# Patient Record
Sex: Female | Born: 1955
Health system: Southern US, Community
[De-identification: ages and names within clinical notes are randomized; demographics above are authoritative.]

## PROBLEM LIST (undated history)

## (undated) ENCOUNTER — Emergency Department (HOSPITAL_COMMUNITY): Admission: EM | Payer: Self-pay | Source: Home / Self Care

## (undated) DIAGNOSIS — F419 Anxiety disorder, unspecified: Secondary | ICD-10-CM

## (undated) DIAGNOSIS — Z8701 Personal history of pneumonia (recurrent): Secondary | ICD-10-CM

## (undated) HISTORY — PX: BLADDER SUSPENSION: SHX72

## (undated) HISTORY — PX: ENDOMETRIAL ABLATION: SHX621

## (undated) HISTORY — DX: Personal history of pneumonia (recurrent): Z87.01

## (undated) HISTORY — DX: Anxiety disorder, unspecified: F41.9

---

## 2005-01-17 ENCOUNTER — Ambulatory Visit: Payer: Self-pay | Admitting: Family Medicine

## 2013-03-25 ENCOUNTER — Ambulatory Visit: Payer: Self-pay | Admitting: Family Medicine

## 2015-09-28 ENCOUNTER — Ambulatory Visit (INDEPENDENT_AMBULATORY_CARE_PROVIDER_SITE_OTHER): Payer: BLUE CROSS/BLUE SHIELD | Admitting: *Deleted

## 2015-09-28 DIAGNOSIS — Z23 Encounter for immunization: Secondary | ICD-10-CM | POA: Diagnosis not present

## 2016-07-26 ENCOUNTER — Encounter (HOSPITAL_COMMUNITY): Payer: Self-pay

## 2016-07-26 ENCOUNTER — Emergency Department (HOSPITAL_COMMUNITY): Payer: BLUE CROSS/BLUE SHIELD

## 2016-07-26 ENCOUNTER — Emergency Department (HOSPITAL_COMMUNITY)
Admission: EM | Admit: 2016-07-26 | Discharge: 2016-07-26 | Disposition: A | Discharge status: Home / Self Care | Payer: BLUE CROSS/BLUE SHIELD | Attending: Emergency Medicine | Admitting: Emergency Medicine

## 2016-07-26 DIAGNOSIS — G43909 Migraine, unspecified, not intractable, without status migrainosus: Secondary | ICD-10-CM | POA: Diagnosis not present

## 2016-07-26 DIAGNOSIS — R51 Headache: Secondary | ICD-10-CM

## 2016-07-26 DIAGNOSIS — G43009 Migraine without aura, not intractable, without status migrainosus: Secondary | ICD-10-CM

## 2016-07-26 DIAGNOSIS — R519 Headache, unspecified: Secondary | ICD-10-CM

## 2016-07-26 LAB — CBC WITH DIFFERENTIAL/PLATELET
BASOS PCT: 0 %
Basophils Absolute: 0 10*3/uL (ref 0.0–0.1)
Eosinophils Absolute: 0.2 10*3/uL (ref 0.0–0.7)
Eosinophils Relative: 2 %
HEMATOCRIT: 40.9 % (ref 36.0–46.0)
HEMOGLOBIN: 13.9 g/dL (ref 12.0–15.0)
LYMPHS ABS: 4.4 10*3/uL — AB (ref 0.7–4.0)
Lymphocytes Relative: 47 %
MCH: 30.5 pg (ref 26.0–34.0)
MCHC: 34 g/dL (ref 30.0–36.0)
MCV: 89.7 fL (ref 78.0–100.0)
MONO ABS: 0.9 10*3/uL (ref 0.1–1.0)
MONOS PCT: 10 %
NEUTROS ABS: 3.8 10*3/uL (ref 1.7–7.7)
NEUTROS PCT: 41 %
Platelets: 290 10*3/uL (ref 150–400)
RBC: 4.56 MIL/uL (ref 3.87–5.11)
RDW: 13.7 % (ref 11.5–15.5)
WBC: 9.3 10*3/uL (ref 4.0–10.5)

## 2016-07-26 LAB — COMPREHENSIVE METABOLIC PANEL
ALBUMIN: 3.7 g/dL (ref 3.5–5.0)
ALK PHOS: 85 U/L (ref 38–126)
ALT: 21 U/L (ref 14–54)
ANION GAP: 7 (ref 5–15)
AST: 19 U/L (ref 15–41)
BILIRUBIN TOTAL: 0.6 mg/dL (ref 0.3–1.2)
BUN: 11 mg/dL (ref 6–20)
CALCIUM: 8.2 mg/dL — AB (ref 8.9–10.3)
CO2: 23 mmol/L (ref 22–32)
Chloride: 105 mmol/L (ref 101–111)
Creatinine, Ser: 0.63 mg/dL (ref 0.44–1.00)
GLUCOSE: 93 mg/dL (ref 65–99)
POTASSIUM: 3.6 mmol/L (ref 3.5–5.1)
Sodium: 135 mmol/L (ref 135–145)
TOTAL PROTEIN: 6.6 g/dL (ref 6.5–8.1)

## 2016-07-26 NOTE — ED Triage Notes (Signed)
Complain of headache and knots in her scalp. States she is being treated with an antibiotic

## 2016-07-26 NOTE — ED Notes (Signed)
Pt being transported to MRI.

## 2016-07-26 NOTE — Discharge Instructions (Signed)
Follow-up with Dr. Doonquah in 1-2 weeks °

## 2016-07-26 NOTE — ED Provider Notes (Signed)
[facility] DEPT Provider Note   CSN: [id number] Arrival date & time: 07/26/16  1126  By signing my name below, I, Freida Busmaniana Omoyeni, attest that this documentation has been prepared under the direction and in the presence of Bethann BerkshireJoseph Vivi Piccirilli, MD . Electronically Signed: Freida Busmaniana Omoyeni, Scribe. 07/26/2016. 1:25 PM.   History   Chief Complaint Chief Complaint  Patient presents with  . Migraine     Patient states since July 13 she's had 5 episodes where she has pressure on the back of her head starts having some blurred vision then she can't see anything at all for about 2-3 minutes. Then for about 15 minutes the vision slowly comes back. She has a history of striking her head back in March when she fell with loss of consciousness   The history is provided by the patient. No language interpreter was used.  Migraine  This is a recurrent problem. The current episode started more than 1 week ago. The problem occurs rarely. The problem has been resolved. Associated symptoms include headaches. Pertinent negatives include no chest pain and no abdominal pain. Nothing aggravates the symptoms. Nothing relieves the symptoms. She has tried nothing for the symptoms. The treatment provided no relief.     HPI Comments:  Anne Martinez is a 60 y.o. female who presents to the Emergency Department complaining of intermittent HA x ~3-4 weeks. Pt describes a pressure sensation to the top of her head. She notes she has been getting boils on her scalp; she was evaluated by her PCP and placed on Keflex which she has completed. She reports associated visual disturbance; states she saw "8 of everything" and then had an episode of vision loss. She has had 5 episodes since 8/13 each lasting a few minutes. She notes gradual onset of vision loss and gradual resolution of these episodes.  No alleviating factors noted.    History reviewed. No pertinent past medical history.  There are no active problems to display for this  patient.   History reviewed. No pertinent surgical history.  OB History    No data available       Home Medications    Prior to Admission medications   Not on File    Family History No family history on file.  Social History Social History  Substance Use Topics  . Smoking status: Never Smoker  . Smokeless tobacco: Never Used  . Alcohol use No     Allergies   Review of patient's allergies indicates not on file.   Review of Systems Review of Systems  Constitutional: Negative for appetite change and fatigue.  HENT: Negative for congestion, ear discharge and sinus pressure.   Eyes: Positive for visual disturbance. Negative for discharge.  Respiratory: Negative for cough.   Cardiovascular: Negative for chest pain.  Gastrointestinal: Negative for abdominal pain and diarrhea.  Genitourinary: Negative for frequency and hematuria.  Musculoskeletal: Negative for back pain.  Skin: Positive for rash ("boil").  Neurological: Positive for headaches. Negative for seizures and syncope.  Psychiatric/Behavioral: Negative for hallucinations.    Physical Exam Updated Vital Signs BP 122/84   Pulse 72   Temp 97.8 F (36.6 C)   Resp 16   Ht 5' (1.524 m)   Wt 144 lb (65.3 kg)   SpO2 100%   BMI 28.12 kg/m   Physical Exam  Constitutional: She is oriented to person, place, and time. She appears well-developed.  HENT:  Head: Normocephalic.  Eyes: Conjunctivae and EOM are normal. No scleral icterus.  Neck:  Neck supple. No thyromegaly present.  Cardiovascular: Normal rate and regular rhythm.  Exam reveals no gallop and no friction rub.   No murmur heard. Pulmonary/Chest: No stridor. She has no wheezes. She has no rales. She exhibits no tenderness.  Abdominal: She exhibits no distension. There is no tenderness. There is no rebound.  Musculoskeletal: Normal range of motion. She exhibits no edema.  Lymphadenopathy:    She has no cervical adenopathy.  Neurological: She is  oriented to person, place, and time. She exhibits normal muscle tone. Coordination normal.  Skin: No rash noted. No erythema.  Small boil to top of head ~ 1 cm   Psychiatric: She has a normal mood and affect. Her behavior is normal.  Nursing note and vitals reviewed.    ED Treatments / Results  DIAGNOSTIC STUDIES:  Oxygen Saturation is 100% on RA, normal by my interpretation.    COORDINATION OF CARE:  1:24 PM Discussed treatment plan with pt at bedside and pt agreed to plan.  Labs (all labs ordered are listed, but only abnormal results are displayed) Labs Reviewed - No data to display  EKG  EKG Interpretation None       Radiology No results found.  Procedures Procedures (including critical care time)  Medications Ordered in ED Medications - No data to display   Initial Impression / Assessment and Plan / ED Course  I have reviewed the triage vital signs and the nursing notes.  Pertinent labs & imaging results that were available during my care of the patient were reviewed by me and considered in my medical decision making (see chart for details).  Clinical Course    MRI of the brain was negative. Labs unremarkable. I spoke with the neurologist Dr.Doonquah and he feels the symptoms are migraine related. He is going to follow-up with the patient in about 2 weeks. We are going to start her on a baby aspirin a day  Final Clinical Impressions(s) / ED Diagnoses   Final diagnoses:  None    New Prescriptions New Prescriptions   No medications on file    The chart was scribed for me under my direct supervision.  I personally performed the history, physical, and medical decision making and all procedures in the evaluation of this patient.Bethann Berkshire.     Byrdie Miyazaki, MD 07/26/16 [phone]

## 2016-09-19 ENCOUNTER — Ambulatory Visit (INDEPENDENT_AMBULATORY_CARE_PROVIDER_SITE_OTHER): Payer: BLUE CROSS/BLUE SHIELD | Admitting: *Deleted

## 2016-09-19 DIAGNOSIS — Z23 Encounter for immunization: Secondary | ICD-10-CM | POA: Diagnosis not present

## 2017-10-15 ENCOUNTER — Ambulatory Visit (INDEPENDENT_AMBULATORY_CARE_PROVIDER_SITE_OTHER): Payer: BLUE CROSS/BLUE SHIELD | Admitting: *Deleted

## 2017-10-15 DIAGNOSIS — Z23 Encounter for immunization: Secondary | ICD-10-CM

## 2019-12-15 DIAGNOSIS — Z03818 Encounter for observation for suspected exposure to other biological agents ruled out: Secondary | ICD-10-CM | POA: Diagnosis not present

## 2019-12-15 DIAGNOSIS — R05 Cough: Secondary | ICD-10-CM | POA: Diagnosis not present

## 2019-12-15 DIAGNOSIS — Z6826 Body mass index (BMI) 26.0-26.9, adult: Secondary | ICD-10-CM | POA: Diagnosis not present

## 2019-12-29 DIAGNOSIS — Z20828 Contact with and (suspected) exposure to other viral communicable diseases: Secondary | ICD-10-CM | POA: Diagnosis not present

## 2020-02-16 DIAGNOSIS — R0602 Shortness of breath: Secondary | ICD-10-CM | POA: Diagnosis not present

## 2020-02-16 DIAGNOSIS — R079 Chest pain, unspecified: Secondary | ICD-10-CM | POA: Diagnosis not present

## 2020-02-16 DIAGNOSIS — R002 Palpitations: Secondary | ICD-10-CM | POA: Diagnosis not present

## 2020-02-16 DIAGNOSIS — R350 Frequency of micturition: Secondary | ICD-10-CM | POA: Diagnosis not present

## 2020-02-16 DIAGNOSIS — R69 Illness, unspecified: Secondary | ICD-10-CM | POA: Diagnosis not present

## 2020-02-16 DIAGNOSIS — Z6826 Body mass index (BMI) 26.0-26.9, adult: Secondary | ICD-10-CM | POA: Diagnosis not present

## 2020-02-24 DIAGNOSIS — E7800 Pure hypercholesterolemia, unspecified: Secondary | ICD-10-CM | POA: Diagnosis not present

## 2020-02-24 DIAGNOSIS — Z23 Encounter for immunization: Secondary | ICD-10-CM | POA: Diagnosis not present

## 2020-02-24 DIAGNOSIS — Z0000 Encounter for general adult medical examination without abnormal findings: Secondary | ICD-10-CM | POA: Diagnosis not present

## 2020-02-24 DIAGNOSIS — R05 Cough: Secondary | ICD-10-CM | POA: Diagnosis not present

## 2020-02-24 DIAGNOSIS — R911 Solitary pulmonary nodule: Secondary | ICD-10-CM | POA: Diagnosis not present

## 2020-02-24 DIAGNOSIS — Z6826 Body mass index (BMI) 26.0-26.9, adult: Secondary | ICD-10-CM | POA: Diagnosis not present

## 2020-02-27 DIAGNOSIS — R911 Solitary pulmonary nodule: Secondary | ICD-10-CM | POA: Diagnosis not present

## 2020-02-27 DIAGNOSIS — R918 Other nonspecific abnormal finding of lung field: Secondary | ICD-10-CM | POA: Diagnosis not present

## 2020-02-27 DIAGNOSIS — I700 Atherosclerosis of aorta: Secondary | ICD-10-CM | POA: Diagnosis not present

## 2020-02-27 DIAGNOSIS — R0602 Shortness of breath: Secondary | ICD-10-CM | POA: Diagnosis not present

## 2020-03-05 ENCOUNTER — Encounter: Payer: Self-pay | Admitting: *Deleted

## 2020-03-08 ENCOUNTER — Encounter: Payer: Self-pay | Admitting: *Deleted

## 2020-03-08 ENCOUNTER — Other Ambulatory Visit: Payer: Self-pay

## 2020-03-08 ENCOUNTER — Encounter: Payer: Self-pay | Admitting: Cardiology

## 2020-03-08 ENCOUNTER — Telehealth: Payer: Self-pay | Admitting: Cardiology

## 2020-03-08 ENCOUNTER — Ambulatory Visit (INDEPENDENT_AMBULATORY_CARE_PROVIDER_SITE_OTHER): Payer: Medicare HMO | Admitting: Cardiology

## 2020-03-08 VITALS — BP 112/72 | HR 67 | Ht 60.0 in | Wt 136.0 lb

## 2020-03-08 DIAGNOSIS — R0789 Other chest pain: Secondary | ICD-10-CM | POA: Diagnosis not present

## 2020-03-08 DIAGNOSIS — R0602 Shortness of breath: Secondary | ICD-10-CM | POA: Diagnosis not present

## 2020-03-08 DIAGNOSIS — R300 Dysuria: Secondary | ICD-10-CM | POA: Diagnosis not present

## 2020-03-08 DIAGNOSIS — I700 Atherosclerosis of aorta: Secondary | ICD-10-CM | POA: Diagnosis not present

## 2020-03-08 DIAGNOSIS — N390 Urinary tract infection, site not specified: Secondary | ICD-10-CM | POA: Diagnosis not present

## 2020-03-08 DIAGNOSIS — R002 Palpitations: Secondary | ICD-10-CM

## 2020-03-08 NOTE — Patient Instructions (Signed)
Your physician recommends that you schedule a follow-up appointment PENDING WITH DR MCDOWELL   Your physician recommends that you continue on your current medications as directed. Please refer to the Current Medication list given to you today.  Your physician has requested that you have an echocardiogram. Echocardiography is a painless test that uses sound waves to create images of your heart. It provides your doctor with information about the size and shape of your heart and how well your heart's chambers and valves are working. This procedure takes approximately one hour. There are no restrictions for this procedure.  Thank you for choosing [facility]!!

## 2020-03-08 NOTE — Progress Notes (Signed)
Cardiology Office Note  Date: 03/08/2020   ID: Anne Martinez, DOB 10-31-1956, MRN [id number]  PCP:  Lawerance Sabal, PA  Cardiologist:  Nona Dell, MD Electrophysiologist:  None   Chief Complaint  Patient presents with  . Shortness of Breath    History of Present Illness: Anne Martinez is a 64 y.o. adult referred for cardiology consultation by Ms. Bufford Buttner PA-C with Dr. Reuel Boom for evaluation of shortness of breath and chest discomfort.  I reviewed the available records which are incomplete.  She presents today stating that she has been experiencing a daily morning cough productive of phlegm since last year.  She does not smoke cigarettes, but smokes marijuana daily, states that this makes her cough as well.  She did have COVID-19 back in January, this made her shortness of breath and cough worse.  She feels a "catch" in her left lower costal area, sometimes worse when coughing or taking a deep breath, no definite angina by description.  She does not report any clear sense of palpitations.  She had a chest x-ray done through [facility] as reviewed below with question of a 7 mm left upper lobe nodule.  Reportedly, she had a chest CT done at [facility] last week that did not show this nodule, however she had evidence of "scarring" based on patient description.  I do not have the study for review today.  She also had recent lab work which we are requesting.  She ambulated in the office on pulse oximeter with no results below 97%.  She has tried an MDI per PCP recently without improvement in shortness of breath or coughing.  She states that she had a prior history of drug abuse, was using meth back in the 1990s, also Percocet.  She reports no substance use except marijuana at this point.  She states that she has had many episodes of pneumonia over the years, does not report any clear history of asthma or COPD.  She quit smoking cigarettes back in the 1970s.  She currently works at  [employer], [employer].  Past Medical History:  Diagnosis Date  . Anxiety   . History of pneumonia     Past Surgical History:  Procedure Laterality Date  . BLADDER SUSPENSION    . ENDOMETRIAL ABLATION      Current Outpatient Medications  Medication Sig Dispense Refill  . albuterol (VENTOLIN HFA) 108 (90 Base) MCG/ACT inhaler Inhale into the lungs every 6 (six) hours as needed for wheezing or shortness of breath.     No current facility-administered medications for this visit.   Allergies:  Ceftin [cefuroxime], Penicillins, and Sulfa antibiotics   Social History: The patient  reports that he has quit smoking. His smoking use included cigarettes. He has never used smokeless tobacco. He reports current drug use. Drug: Marijuana. He reports that he does not drink alcohol.   Family History: The patient's family history includes Heart attack in his father; Heart disease in his mother; Hypertension in his mother.   ROS:   Frequent urination and nocturia.  Physical Exam: VS:  BP 112/72   Pulse 67   Ht 5' (1.524 m)   Wt 136 lb (61.7 kg)   SpO2 97%   BMI 26.56 kg/m , BMI Body mass index is 26.56 kg/m.  Wt Readings from Last 3 Encounters:  03/08/20 136 lb (61.7 kg)  07/26/16 144 lb (65.3 kg)    General: Patient appears comfortable at rest. HEENT: Conjunctiva and lids normal, wearing a  mask. Neck: Supple, no elevated JVP or carotid bruits, no thyromegaly. Lungs: No active wheezing, nonlabored breathing at rest. Cardiac: Regular rate and rhythm, no S3, 2/6 systolic murmur, no pericardial rub. Abdomen: Soft, bowel sounds present. Extremities: No pitting edema, distal pulses 2+. Skin: Warm and dry. Musculoskeletal: No kyphosis. Neuropsychiatric: Alert and oriented x3, affect grossly appropriate.  ECG:  An ECG dated 02/16/2020 was personally reviewed today and demonstrated:  Sinus rhythm with PACs.  Recent Labwork:  No interval lab work for review  today.  Other Studies Reviewed Today:  Chest x-ray 02/16/2020 ([facility]): Heart size normal limits, clear lung fields, aortic atherosclerosis, 7 mm nodule within left upper lobe.   Assessment and Plan:  1.  Shortness of breath with daily morning cough, mildly productive.  Patient has a remote history of cigarette use, at this point smokes marijuana daily.  She states that the symptoms have been worse since last year, she also had COVID-19 in January (was not hospitalized).  Requesting recent chest CT from [facility] for review, she may need a formal pulmonary consultation.  She was not found to have oxygen desaturation today in the office with ambulation.  She is already trying to cut back on marijuana use.  She reports an atypical "catch" in her left lower costal area, no definitive angina.  We will obtain an echocardiogram to assess cardiac structure and function.  2.  Incidentally noted aortic atherosclerosis by plain film of the chest.  Will review chest CT results as well.  She was aware of this, I talked with her about making sure lipids are in order, she states that this has been checked by her PCP.  Medication Adjustments/Labs and Tests Ordered: Current medicines are reviewed at length with the patient today.  Concerns regarding medicines are outlined above.   Tests Ordered: Orders Placed This Encounter  Procedures  . ECHOCARDIOGRAM COMPLETE    Medication Changes: No orders of the defined types were placed in this encounter.   Disposition:  Follow up test results and determine disposition.  Signed, Satira Sark, MD, [facility] 03/08/2020 9:33 AM    [facility] [place], [place], [place] [address] Phone: [phone]; Fax: [phone]

## 2020-03-08 NOTE — Telephone Encounter (Signed)
  Precert needed for:   Echo  Location: [facility]    Date: March 30, 2020

## 2020-03-10 ENCOUNTER — Telehealth: Payer: Self-pay | Admitting: *Deleted

## 2020-03-10 DIAGNOSIS — R059 Cough, unspecified: Secondary | ICD-10-CM

## 2020-03-10 DIAGNOSIS — R0602 Shortness of breath: Secondary | ICD-10-CM

## 2020-03-10 DIAGNOSIS — R05 Cough: Secondary | ICD-10-CM

## 2020-03-10 NOTE — Telephone Encounter (Signed)
Patient informed and verbalized understanding of plan. Per patient, she has not been referred to a pulmonologist. Copy sent to PCP

## 2020-03-10 NOTE — Telephone Encounter (Signed)
-----   Message from Jonelle Sidle, MD sent at 03/08/2020  2:40 PM EDT ----- Results reviewed.  Patient was seen as a new consult this morning.  Please let her know that I had a chance to review her chest CT report.  Report indicated the presence of multiple small bilateral pulmonary nodules, also interstitial lung disease.  Based on this I do think that a formal pulmonary consultation would be in order in light of her cough and shortness of breath.  If she does not already have this planned per PCP, please refer her.

## 2020-03-30 ENCOUNTER — Other Ambulatory Visit: Payer: Self-pay

## 2020-03-30 ENCOUNTER — Ambulatory Visit (INDEPENDENT_AMBULATORY_CARE_PROVIDER_SITE_OTHER): Payer: Medicare HMO

## 2020-03-30 DIAGNOSIS — R0602 Shortness of breath: Secondary | ICD-10-CM | POA: Diagnosis not present

## 2020-04-01 ENCOUNTER — Telehealth: Payer: Self-pay | Admitting: *Deleted

## 2020-04-01 NOTE — Telephone Encounter (Signed)
Patient informed. Copy sent to PCP °

## 2020-04-01 NOTE — Telephone Encounter (Signed)
-----   Message from Jonelle Sidle, MD sent at 03/30/2020 12:04 PM EDT ----- Results reviewed.  LVEF is normal at 60 to 65% with mild diastolic dysfunction.  RV contraction normal, no major valvular abnormalities beyond moderate tricuspid regurgitation.  No further cardiac testing is planned.  As already mentioned after review of outside chest CT, formal pulmonary consultation is recommended.

## 2020-04-13 DIAGNOSIS — S61431A Puncture wound without foreign body of right hand, initial encounter: Secondary | ICD-10-CM | POA: Diagnosis not present

## 2020-04-13 DIAGNOSIS — Z6827 Body mass index (BMI) 27.0-27.9, adult: Secondary | ICD-10-CM | POA: Diagnosis not present

## 2020-04-13 DIAGNOSIS — M79641 Pain in right hand: Secondary | ICD-10-CM | POA: Diagnosis not present

## 2020-05-06 DIAGNOSIS — S61431A Puncture wound without foreign body of right hand, initial encounter: Secondary | ICD-10-CM | POA: Diagnosis not present

## 2020-05-06 DIAGNOSIS — R69 Illness, unspecified: Secondary | ICD-10-CM | POA: Diagnosis not present

## 2020-05-06 DIAGNOSIS — M79641 Pain in right hand: Secondary | ICD-10-CM | POA: Diagnosis not present

## 2020-05-06 DIAGNOSIS — Z6826 Body mass index (BMI) 26.0-26.9, adult: Secondary | ICD-10-CM | POA: Diagnosis not present

## 2020-05-07 ENCOUNTER — Other Ambulatory Visit: Payer: Self-pay

## 2020-05-07 ENCOUNTER — Encounter: Payer: Self-pay | Admitting: Internal Medicine

## 2020-05-07 ENCOUNTER — Ambulatory Visit: Payer: Medicare HMO | Admitting: Internal Medicine

## 2020-05-07 DIAGNOSIS — J449 Chronic obstructive pulmonary disease, unspecified: Secondary | ICD-10-CM | POA: Diagnosis not present

## 2020-05-07 DIAGNOSIS — R918 Other nonspecific abnormal finding of lung field: Secondary | ICD-10-CM | POA: Insufficient documentation

## 2020-05-07 NOTE — Patient Instructions (Signed)
For cough >  mucinex or mucinex dm up to 1200 mg every 12 hours as needed  Symbicort 80 up to 2 puff every 12 hours x one week then try off and restart if needed  Most important step:  Breath clean air   We will call you in March 2022 to set up a follow up ct of your chest (high resolution)

## 2020-05-07 NOTE — Progress Notes (Signed)
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 then p trip to [place] at [place] back one week and had expose to niece both sick Jan 6th and pos test for covid on the 9th with worse symptoms "felt like drowning" with severe cough but never desats gradually improved p 3 weeks and returned to work but noted and gradually improved to baseline but concerned with am mucus productive black spots just in ams so referred to pulmonary clinic in [place]  05/07/2020 by Dr   Domenic Polite with w/u with echo c/w Mild diastolic dysfunction and TR      History of Present Illness  05/07/2020  Pulmonary/ 1st office eval/Anne Martinez  Chief Complaint  Patient presents with  . Pulmonary Consult    Referred by Dr Domenic Polite. Pt c/o SOB x 5 months- gets winded walking up steps.  She states when she gets up in the am she coughs up mucus with black spots in it. She was dx with Covid 19 12/08/19.    Dyspnea:  Not limited by breathing from desired activities  But relattively inactive Cough: mostly in am's is when brings up black specks/ worse with  perfume exp   Sleep: none  SABA use: none    No obvious day to day or daytime variability or assoc excess/ purulent sputum or mucus plugs or hemoptysis or cp or chest tightness, subjective wheeze or overt sinus or hb symptoms.   Sleeping  without nocturnal   xacerbation  of respiratory  c/o's or need for noct saba. Also denies any obvious fluctuation of symptoms with weather or environmental changes or other aggravating or alleviating factors except as outlined above   No unusual exposure hx or h/o childhood pna/ asthma or knowledge of premature birth.  Current  Allergies, Complete Past Medical History, Past Surgical History, Family History, and Social History were reviewed in [facility] record.  ROS  The following are not active complaints unless bolded Hoarseness, sore throat, dysphagia, dental problems, itching, sneezing,  nasal congestion or discharge of excess mucus or purulent secretions, ear ache,   fever, chills, sweats, unintended wt loss or wt gain, classically pleuritic or exertional cp,  orthopnea pnd or arm/hand swelling  or leg swelling, presyncope, palpitations, abdominal pain, anorexia, nausea, vomiting, diarrhea  or change in bowel habits or change in bladder habits, change in stools or change in urine, dysuria, hematuria,  rash, arthralgias, visual complaints, headache, numbness, weakness or ataxia or problems with walking or coordination,  change in mood or  memory.          Past Medical History:  Diagnosis Date  . Anxiety   . History of pneumonia     Outpatient Medications Prior to Visit  Medication Sig Dispense Refill  . SYMBICORT 80-4.5 MCG/ACT inhaler Inhale 2 puffs into the lungs daily.    Marland Kitchen albuterol (VENTOLIN HFA) 108 (90 Base) MCG/ACT inhaler Inhale into the lungs every 6 (six) hours as needed for wheezing or shortness of breath.     No facility-administered medications prior to visit.     Objective:     BP 140/80 (BP Location:  Left Arm, Cuff Size: Normal)   Pulse 90   Temp (!) 97 F (36.1 C) (Temporal)   Ht 5' (1.524 m)   Wt 135 lb (61.2 kg)   SpO2 98% Comment: on RA  BMI 26.37 kg/m   SpO2: 98 %(on RA)    HEENT : pt wearing mask not removed for exam due to covid -19 concerns.    NECK :  without JVD/Nodes/TM/ nl carotid upstrokes bilaterally   LUNGS: no acc muscle use,  Nl contour chest which is clear to A and P bilaterally without cough on insp or exp maneuvers   CV:  RRR  no s3 or murmur or increase in P2, and no edema   ABD:  soft and nontender with nl inspiratory excursion  in the supine position. No bruits or organomegaly appreciated, bowel sounds nl  MS:  Nl gait/ ext warm without deformities, calf tenderness, cyanosis or clubbing No obvious joint restrictions   SKIN: warm and dry without lesions    NEURO:  alert, approp, nl sensorium with  no motor or cerebellar deficits apparent.         Assessment   Multiple pulmonary nodules determined by computed tomography of lung See CT chest 02/27/20  Lungs/Pleura: There are multiple bilateral small pulmonary nodules. The largest nodule is in the left upper lobe measuring 5.5 mm correlating with the density seen on chest x-ray. There are 2 small nodules along the right minor fissure likely representing intrapulmonary nodes. There are multiple other small bilateral pulmonary nodules including a 4 mm nodule at the left lung base posteriorly on image 119 and 4 mm nodule in the posterior aspect of the right upper lobe on image 40 both on series 4. There is chronic interstitial disease in the periphery of both lungs primarily in the lower lobes. rec : re check HRCT  02/21/21 placed in reminder file   CT results reviewed with pt >>> Too small for PET or bx, not suspicious enough for excisional bx > really only option for now is follow the Fleischner society guidelines as rec by radiology = 1 year f/u for both the MPNs and the ? ILD > HRCT as above should be adequate to follow both   Discussed in detail all the  indications, usual  risks and alternatives  relative to the benefits with patient who agrees to proceed with w/u as outlined.     In meantime asked the pt to commit to breathing clean air.                   COPD GOLD 0 clinically with CB/ still smoking  MJ smoker only  - demonstrated use of MDI 05/07/2020   Most likely has CB on basis of inhaled combustion products of MJ and only very mild AB   Based on two studies from NEJM  378; 20 p 1865 (2018) and 380 : p2020-30 (2019) in pts with mild asthma it  is reasonable to use low dose symbicort eg 80 2bid "prn" flare in this setting but I emphasized this was only shown with symbicort and takes advantage of the rapid onset of action but is not the same as "rescue therapy" but can be stopped once the acute symptoms have resolved and the need for rescue has been minimized (< 2 x weekly)    - The proper method of use, as well as anticipated side effects, of a metered-dose inhaler were discussed and demonstrated to the patient.  Each maintenance medication was reviewed in detail including emphasizing most importantly the difference between maintenance and prns and under what circumstances the prns are to be triggered using an action plan format where appropriate.  Total time for H and P, chart review, counseling, teaching device and generating customized AVS unique to this office visit / charting = 60 min            Sandrea Hughs, MD 05/07/2020

## 2020-05-07 NOTE — Assessment & Plan Note (Addendum)
See CT chest 02/27/20  Lungs/Pleura: There are multiple bilateral small pulmonary nodules. The largest nodule is in the left upper lobe measuring 5.5 mm correlating with the density seen on chest x-ray. There are 2 small nodules along the right minor fissure likely representing intrapulmonary nodes. There are multiple other small bilateral pulmonary nodules including a 4 mm nodule at the left lung base posteriorly on image 119 and 4 mm nodule in the posterior aspect of the right upper lobe on image 40 both on series 4. There is chronic interstitial disease in the periphery of both lungs primarily in the lower lobes. rec : re check HRCT  02/21/21 placed in reminder file   CT results reviewed with pt >>> Too small for PET or bx, not suspicious enough for excisional bx > really only option for now is follow the Fleischner society guidelines as rec by radiology = 1 year f/u for both the MPNs and the ? ILD > HRCT as above should be adequate to follow both   Discussed in detail all the  indications, usual  risks and alternatives  relative to the benefits with patient who agrees to proceed with w/u as outlined.     In meantime asked the pt to commit to breathing clean air.

## 2020-05-07 NOTE — Assessment & Plan Note (Signed)
MJ smoker only  - demonstrated use of MDI 05/07/2020   Most likely has CB on basis of inhaled combustion products of MJ and only very mild AB   Based on two studies from NEJM  378; 20 p 1865 (2018) and 380 : p2020-30 (2019) in pts with mild asthma it is reasonable to use low dose symbicort eg 80 2bid "prn" flare in this setting but I emphasized this was only shown with symbicort and takes advantage of the rapid onset of action but is not the same as "rescue therapy" but can be stopped once the acute symptoms have resolved and the need for rescue has been minimized (< 2 x weekly)    - The proper method of use, as well as anticipated side effects, of a metered-dose inhaler were discussed and demonstrated to the patient.          Each maintenance medication was reviewed in detail including emphasizing most importantly the difference between maintenance and prns and under what circumstances the prns are to be triggered using an action plan format where appropriate.  Total time for H and P, chart review, counseling, teaching device and generating customized AVS unique to this office visit / charting = 60 min

## 2020-06-28 DIAGNOSIS — R69 Illness, unspecified: Secondary | ICD-10-CM | POA: Diagnosis not present

## 2020-07-03 DIAGNOSIS — M25572 Pain in left ankle and joints of left foot: Secondary | ICD-10-CM | POA: Diagnosis not present

## 2020-07-03 DIAGNOSIS — S93402A Sprain of unspecified ligament of left ankle, initial encounter: Secondary | ICD-10-CM | POA: Diagnosis not present

## 2020-08-02 DIAGNOSIS — N3946 Mixed incontinence: Secondary | ICD-10-CM | POA: Diagnosis not present

## 2020-08-02 DIAGNOSIS — N8111 Cystocele, midline: Secondary | ICD-10-CM | POA: Diagnosis not present

## 2020-08-02 DIAGNOSIS — R82998 Other abnormal findings in urine: Secondary | ICD-10-CM | POA: Diagnosis not present

## 2020-08-02 DIAGNOSIS — R8290 Unspecified abnormal findings in urine: Secondary | ICD-10-CM | POA: Diagnosis not present

## 2020-08-02 DIAGNOSIS — N958 Other specified menopausal and perimenopausal disorders: Secondary | ICD-10-CM | POA: Diagnosis not present

## 2020-08-02 DIAGNOSIS — N8110 Cystocele, unspecified: Secondary | ICD-10-CM | POA: Diagnosis not present

## 2020-08-02 DIAGNOSIS — N3281 Overactive bladder: Secondary | ICD-10-CM | POA: Diagnosis not present

## 2020-08-05 DIAGNOSIS — N3281 Overactive bladder: Secondary | ICD-10-CM | POA: Diagnosis not present

## 2020-08-05 DIAGNOSIS — N3946 Mixed incontinence: Secondary | ICD-10-CM | POA: Diagnosis not present

## 2020-08-05 DIAGNOSIS — N993 Prolapse of vaginal vault after hysterectomy: Secondary | ICD-10-CM | POA: Diagnosis not present

## 2020-08-05 DIAGNOSIS — N3941 Urge incontinence: Secondary | ICD-10-CM | POA: Diagnosis not present

## 2020-08-05 DIAGNOSIS — Z8701 Personal history of pneumonia (recurrent): Secondary | ICD-10-CM | POA: Diagnosis not present

## 2020-08-05 DIAGNOSIS — N949 Unspecified condition associated with female genital organs and menstrual cycle: Secondary | ICD-10-CM | POA: Diagnosis not present

## 2020-08-05 DIAGNOSIS — N393 Stress incontinence (female) (male): Secondary | ICD-10-CM | POA: Diagnosis not present

## 2020-09-01 DIAGNOSIS — R69 Illness, unspecified: Secondary | ICD-10-CM | POA: Diagnosis not present

## 2020-09-01 DIAGNOSIS — J189 Pneumonia, unspecified organism: Secondary | ICD-10-CM | POA: Diagnosis not present

## 2020-09-07 DIAGNOSIS — Z8701 Personal history of pneumonia (recurrent): Secondary | ICD-10-CM | POA: Diagnosis not present

## 2020-09-15 DIAGNOSIS — Z23 Encounter for immunization: Secondary | ICD-10-CM | POA: Diagnosis not present

## 2020-09-29 DIAGNOSIS — Z23 Encounter for immunization: Secondary | ICD-10-CM | POA: Diagnosis not present

## 2020-10-14 DIAGNOSIS — H6122 Impacted cerumen, left ear: Secondary | ICD-10-CM | POA: Diagnosis not present

## 2020-10-19 DIAGNOSIS — N993 Prolapse of vaginal vault after hysterectomy: Secondary | ICD-10-CM | POA: Diagnosis not present

## 2020-10-19 DIAGNOSIS — N393 Stress incontinence (female) (male): Secondary | ICD-10-CM | POA: Diagnosis not present

## 2020-10-19 DIAGNOSIS — N3946 Mixed incontinence: Secondary | ICD-10-CM | POA: Diagnosis not present

## 2020-10-22 DIAGNOSIS — N393 Stress incontinence (female) (male): Secondary | ICD-10-CM | POA: Diagnosis not present

## 2020-10-22 DIAGNOSIS — N958 Other specified menopausal and perimenopausal disorders: Secondary | ICD-10-CM | POA: Diagnosis not present

## 2020-10-22 DIAGNOSIS — Z780 Asymptomatic menopausal state: Secondary | ICD-10-CM | POA: Diagnosis not present

## 2020-10-22 DIAGNOSIS — N3941 Urge incontinence: Secondary | ICD-10-CM | POA: Diagnosis not present

## 2020-10-22 DIAGNOSIS — N812 Incomplete uterovaginal prolapse: Secondary | ICD-10-CM | POA: Diagnosis not present

## 2020-10-22 DIAGNOSIS — R69 Illness, unspecified: Secondary | ICD-10-CM | POA: Diagnosis not present

## 2020-10-22 DIAGNOSIS — N3281 Overactive bladder: Secondary | ICD-10-CM | POA: Diagnosis not present

## 2020-10-22 DIAGNOSIS — Z8616 Personal history of COVID-19: Secondary | ICD-10-CM | POA: Diagnosis not present

## 2020-10-22 DIAGNOSIS — N993 Prolapse of vaginal vault after hysterectomy: Secondary | ICD-10-CM | POA: Diagnosis not present

## 2020-10-23 DIAGNOSIS — N993 Prolapse of vaginal vault after hysterectomy: Secondary | ICD-10-CM | POA: Diagnosis not present

## 2020-10-27 DIAGNOSIS — Z8742 Personal history of other diseases of the female genital tract: Secondary | ICD-10-CM | POA: Diagnosis not present

## 2020-11-02 DIAGNOSIS — N393 Stress incontinence (female) (male): Secondary | ICD-10-CM | POA: Diagnosis not present

## 2020-11-02 DIAGNOSIS — Z8616 Personal history of COVID-19: Secondary | ICD-10-CM | POA: Diagnosis not present

## 2020-11-23 DIAGNOSIS — N393 Stress incontinence (female) (male): Secondary | ICD-10-CM | POA: Diagnosis not present

## 2020-11-23 DIAGNOSIS — N993 Prolapse of vaginal vault after hysterectomy: Secondary | ICD-10-CM | POA: Diagnosis not present

## 2020-12-23 DIAGNOSIS — U071 COVID-19: Secondary | ICD-10-CM | POA: Diagnosis not present

## 2020-12-23 DIAGNOSIS — R509 Fever, unspecified: Secondary | ICD-10-CM | POA: Diagnosis not present

## 2020-12-23 DIAGNOSIS — Z20822 Contact with and (suspected) exposure to covid-19: Secondary | ICD-10-CM | POA: Diagnosis not present

## 2020-12-23 DIAGNOSIS — Z20828 Contact with and (suspected) exposure to other viral communicable diseases: Secondary | ICD-10-CM | POA: Diagnosis not present

## 2021-02-16 ENCOUNTER — Other Ambulatory Visit: Payer: Self-pay | Admitting: Internal Medicine

## 2021-02-16 DIAGNOSIS — J849 Interstitial pulmonary disease, unspecified: Secondary | ICD-10-CM

## 2021-02-22 ENCOUNTER — Ambulatory Visit
Admission: RE | Admit: 2021-02-22 | Discharge: 2021-02-22 | Disposition: A | Discharge status: Home / Self Care | Payer: Medicare HMO | Source: Ambulatory Visit | Attending: Internal Medicine | Admitting: Internal Medicine

## 2021-02-22 DIAGNOSIS — J849 Interstitial pulmonary disease, unspecified: Secondary | ICD-10-CM

## 2021-02-22 DIAGNOSIS — R918 Other nonspecific abnormal finding of lung field: Secondary | ICD-10-CM | POA: Diagnosis not present

## 2021-03-02 DIAGNOSIS — N3001 Acute cystitis with hematuria: Secondary | ICD-10-CM | POA: Diagnosis not present

## 2021-03-02 DIAGNOSIS — R350 Frequency of micturition: Secondary | ICD-10-CM | POA: Diagnosis not present

## 2021-03-02 DIAGNOSIS — Z6828 Body mass index (BMI) 28.0-28.9, adult: Secondary | ICD-10-CM | POA: Diagnosis not present

## 2021-03-10 DIAGNOSIS — S99922A Unspecified injury of left foot, initial encounter: Secondary | ICD-10-CM | POA: Diagnosis not present

## 2021-03-10 DIAGNOSIS — W2201XA Walked into wall, initial encounter: Secondary | ICD-10-CM | POA: Diagnosis not present

## 2021-03-10 DIAGNOSIS — M79675 Pain in left toe(s): Secondary | ICD-10-CM | POA: Diagnosis not present

## 2021-04-15 DIAGNOSIS — S63501A Unspecified sprain of right wrist, initial encounter: Secondary | ICD-10-CM | POA: Diagnosis not present

## 2021-05-09 DIAGNOSIS — R509 Fever, unspecified: Secondary | ICD-10-CM | POA: Diagnosis not present

## 2021-05-09 DIAGNOSIS — Z20828 Contact with and (suspected) exposure to other viral communicable diseases: Secondary | ICD-10-CM | POA: Diagnosis not present

## 2021-05-09 DIAGNOSIS — J209 Acute bronchitis, unspecified: Secondary | ICD-10-CM | POA: Diagnosis not present

## 2021-08-04 DIAGNOSIS — Z6826 Body mass index (BMI) 26.0-26.9, adult: Secondary | ICD-10-CM | POA: Diagnosis not present

## 2021-08-04 DIAGNOSIS — N399 Disorder of urinary system, unspecified: Secondary | ICD-10-CM | POA: Diagnosis not present

## 2021-08-04 DIAGNOSIS — Z1331 Encounter for screening for depression: Secondary | ICD-10-CM | POA: Diagnosis not present

## 2021-08-04 DIAGNOSIS — L723 Sebaceous cyst: Secondary | ICD-10-CM | POA: Diagnosis not present

## 2021-08-04 DIAGNOSIS — Z1389 Encounter for screening for other disorder: Secondary | ICD-10-CM | POA: Diagnosis not present

## 2021-09-03 DIAGNOSIS — S61210A Laceration without foreign body of right index finger without damage to nail, initial encounter: Secondary | ICD-10-CM | POA: Diagnosis not present

## 2021-09-03 DIAGNOSIS — Z6827 Body mass index (BMI) 27.0-27.9, adult: Secondary | ICD-10-CM | POA: Diagnosis not present

## 2021-09-18 DIAGNOSIS — S7012XA Contusion of left thigh, initial encounter: Secondary | ICD-10-CM | POA: Diagnosis not present

## 2021-10-03 DIAGNOSIS — H524 Presbyopia: Secondary | ICD-10-CM | POA: Diagnosis not present

## 2021-10-03 DIAGNOSIS — H52229 Regular astigmatism, unspecified eye: Secondary | ICD-10-CM | POA: Diagnosis not present

## 2021-10-03 DIAGNOSIS — H5210 Myopia, unspecified eye: Secondary | ICD-10-CM | POA: Diagnosis not present

## 2021-10-03 DIAGNOSIS — Z0100 Encounter for examination of eyes and vision without abnormal findings: Secondary | ICD-10-CM | POA: Diagnosis not present

## 2021-10-18 DIAGNOSIS — Z23 Encounter for immunization: Secondary | ICD-10-CM | POA: Diagnosis not present

## 2021-10-31 DIAGNOSIS — Z23 Encounter for immunization: Secondary | ICD-10-CM | POA: Diagnosis not present

## 2021-12-01 DIAGNOSIS — T83722A Exposure of implanted urethral mesh into urethra, initial encounter: Secondary | ICD-10-CM | POA: Diagnosis not present

## 2021-12-01 DIAGNOSIS — Z09 Encounter for follow-up examination after completed treatment for conditions other than malignant neoplasm: Secondary | ICD-10-CM | POA: Diagnosis not present

## 2022-03-14 DIAGNOSIS — R296 Repeated falls: Secondary | ICD-10-CM | POA: Diagnosis not present

## 2022-03-14 DIAGNOSIS — S0990XA Unspecified injury of head, initial encounter: Secondary | ICD-10-CM | POA: Diagnosis not present

## 2022-03-14 DIAGNOSIS — S8991XA Unspecified injury of right lower leg, initial encounter: Secondary | ICD-10-CM | POA: Diagnosis not present

## 2022-03-14 DIAGNOSIS — W228XXA Striking against or struck by other objects, initial encounter: Secondary | ICD-10-CM | POA: Diagnosis not present

## 2022-03-14 DIAGNOSIS — S93401A Sprain of unspecified ligament of right ankle, initial encounter: Secondary | ICD-10-CM | POA: Diagnosis not present

## 2022-03-14 DIAGNOSIS — M25572 Pain in left ankle and joints of left foot: Secondary | ICD-10-CM | POA: Diagnosis not present

## 2022-03-14 DIAGNOSIS — M25571 Pain in right ankle and joints of right foot: Secondary | ICD-10-CM | POA: Diagnosis not present

## 2022-03-14 DIAGNOSIS — Z882 Allergy status to sulfonamides status: Secondary | ICD-10-CM | POA: Diagnosis not present

## 2022-03-14 DIAGNOSIS — S99912A Unspecified injury of left ankle, initial encounter: Secondary | ICD-10-CM | POA: Diagnosis not present

## 2022-03-14 DIAGNOSIS — Z880 Allergy status to penicillin: Secondary | ICD-10-CM | POA: Diagnosis not present

## 2022-03-14 DIAGNOSIS — W1839XA Other fall on same level, initial encounter: Secondary | ICD-10-CM | POA: Diagnosis not present

## 2022-03-14 DIAGNOSIS — S060X9A Concussion with loss of consciousness of unspecified duration, initial encounter: Secondary | ICD-10-CM | POA: Diagnosis not present

## 2022-03-14 DIAGNOSIS — S90511A Abrasion, right ankle, initial encounter: Secondary | ICD-10-CM | POA: Diagnosis not present

## 2022-03-14 DIAGNOSIS — Z881 Allergy status to other antibiotic agents status: Secondary | ICD-10-CM | POA: Diagnosis not present

## 2022-03-14 DIAGNOSIS — S93409A Sprain of unspecified ligament of unspecified ankle, initial encounter: Secondary | ICD-10-CM | POA: Diagnosis not present

## 2022-03-14 DIAGNOSIS — S99922A Unspecified injury of left foot, initial encounter: Secondary | ICD-10-CM | POA: Diagnosis not present

## 2022-03-14 DIAGNOSIS — S060X1A Concussion with loss of consciousness of 30 minutes or less, initial encounter: Secondary | ICD-10-CM | POA: Diagnosis not present

## 2022-03-23 DIAGNOSIS — Z6827 Body mass index (BMI) 27.0-27.9, adult: Secondary | ICD-10-CM | POA: Diagnosis not present

## 2022-03-23 DIAGNOSIS — L03115 Cellulitis of right lower limb: Secondary | ICD-10-CM | POA: Diagnosis not present

## 2022-03-23 DIAGNOSIS — R002 Palpitations: Secondary | ICD-10-CM | POA: Diagnosis not present

## 2022-03-23 DIAGNOSIS — I10 Essential (primary) hypertension: Secondary | ICD-10-CM | POA: Diagnosis not present

## 2022-03-23 DIAGNOSIS — S060XAA Concussion with loss of consciousness status unknown, initial encounter: Secondary | ICD-10-CM | POA: Diagnosis not present

## 2022-04-01 DIAGNOSIS — M5412 Radiculopathy, cervical region: Secondary | ICD-10-CM | POA: Diagnosis not present

## 2022-04-01 DIAGNOSIS — M25511 Pain in right shoulder: Secondary | ICD-10-CM | POA: Diagnosis not present

## 2022-04-04 DIAGNOSIS — R030 Elevated blood-pressure reading, without diagnosis of hypertension: Secondary | ICD-10-CM | POA: Diagnosis not present

## 2022-04-04 DIAGNOSIS — R69 Illness, unspecified: Secondary | ICD-10-CM | POA: Diagnosis not present

## 2022-04-04 DIAGNOSIS — Z6828 Body mass index (BMI) 28.0-28.9, adult: Secondary | ICD-10-CM | POA: Diagnosis not present

## 2022-04-04 DIAGNOSIS — M25511 Pain in right shoulder: Secondary | ICD-10-CM | POA: Diagnosis not present

## 2022-04-04 DIAGNOSIS — I10 Essential (primary) hypertension: Secondary | ICD-10-CM | POA: Diagnosis not present

## 2022-04-06 DIAGNOSIS — Z882 Allergy status to sulfonamides status: Secondary | ICD-10-CM | POA: Diagnosis not present

## 2022-04-06 DIAGNOSIS — S46911A Strain of unspecified muscle, fascia and tendon at shoulder and upper arm level, right arm, initial encounter: Secondary | ICD-10-CM | POA: Diagnosis not present

## 2022-04-06 DIAGNOSIS — S43401A Unspecified sprain of right shoulder joint, initial encounter: Secondary | ICD-10-CM | POA: Diagnosis not present

## 2022-04-06 DIAGNOSIS — W1839XA Other fall on same level, initial encounter: Secondary | ICD-10-CM | POA: Diagnosis not present

## 2022-04-06 DIAGNOSIS — M25511 Pain in right shoulder: Secondary | ICD-10-CM | POA: Diagnosis not present

## 2022-04-06 DIAGNOSIS — M5412 Radiculopathy, cervical region: Secondary | ICD-10-CM | POA: Diagnosis not present

## 2022-04-06 DIAGNOSIS — Z880 Allergy status to penicillin: Secondary | ICD-10-CM | POA: Diagnosis not present

## 2022-04-06 DIAGNOSIS — X58XXXA Exposure to other specified factors, initial encounter: Secondary | ICD-10-CM | POA: Diagnosis not present

## 2022-04-10 DIAGNOSIS — M25611 Stiffness of right shoulder, not elsewhere classified: Secondary | ICD-10-CM | POA: Diagnosis not present

## 2022-04-10 DIAGNOSIS — R29898 Other symptoms and signs involving the musculoskeletal system: Secondary | ICD-10-CM | POA: Diagnosis not present

## 2022-04-10 DIAGNOSIS — M25511 Pain in right shoulder: Secondary | ICD-10-CM | POA: Diagnosis not present

## 2022-04-17 DIAGNOSIS — Z20828 Contact with and (suspected) exposure to other viral communicable diseases: Secondary | ICD-10-CM | POA: Diagnosis not present

## 2022-04-17 DIAGNOSIS — I10 Essential (primary) hypertension: Secondary | ICD-10-CM | POA: Diagnosis not present

## 2022-04-17 DIAGNOSIS — M79606 Pain in leg, unspecified: Secondary | ICD-10-CM | POA: Diagnosis not present

## 2022-04-17 DIAGNOSIS — Z6826 Body mass index (BMI) 26.0-26.9, adult: Secondary | ICD-10-CM | POA: Diagnosis not present

## 2022-04-27 DIAGNOSIS — Z1211 Encounter for screening for malignant neoplasm of colon: Secondary | ICD-10-CM | POA: Diagnosis not present

## 2022-06-23 IMAGING — CT CT CHEST HIGH RESOLUTION W/O CM
2 of 7 series · 15 of 36 positions shown, 18 images · non-contrast
Comparison: 02/27/2020

CLINICAL DATA: Interstitial lung disease

EXAM:
CT CHEST WITHOUT CONTRAST
TECHNIQUE: Multidetector CT imaging of the chest was performed following the
standard protocol without intravenous contrast. High resolution
imaging of the lungs, as well as inspiratory and expiratory imaging,
was performed.

[Series 4: chest 2.00 br36 s3 cor soft · coronal · 0.61mm/px · 3 of 192 slices shown]
[im 39/192  lung]
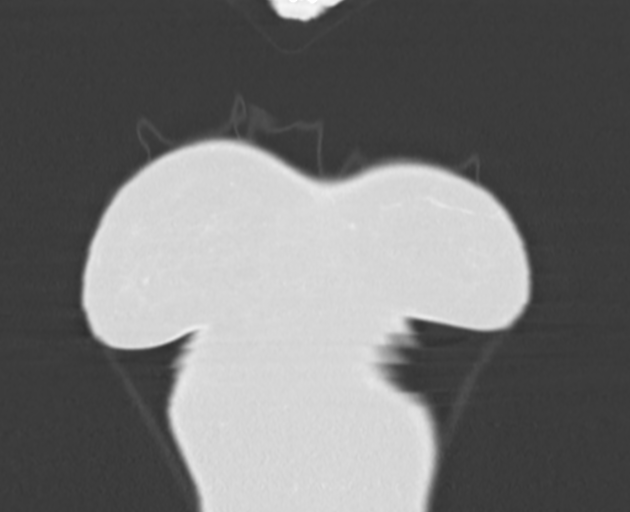
[im 77/192  lung]
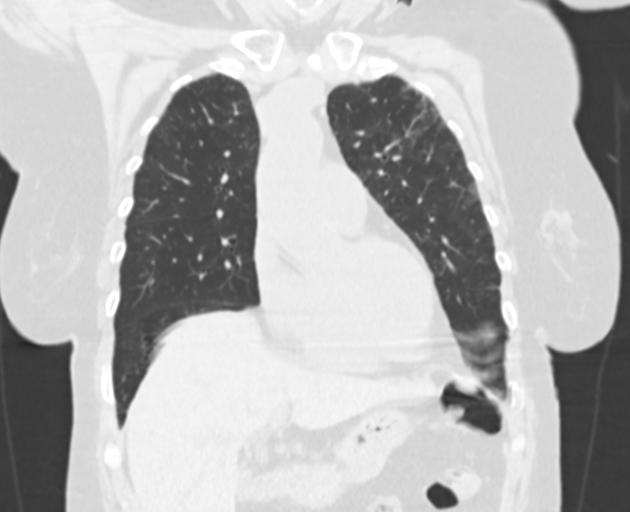
[im 115/192  lung]
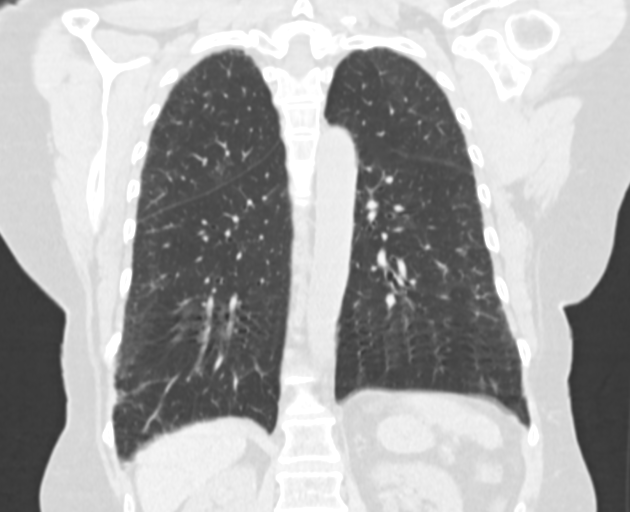

[Series 11: chest 1.00 br60 s3 high res thins 1x1 mm · axial · 0.75mm/px · z∈[+1515,+1779]mm · 12 of 312 slices shown, 15 images]
[im 24/312  mediastinal]
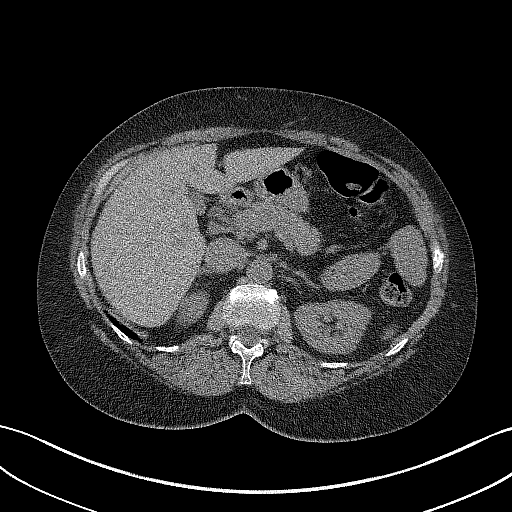
[im 24/312  lung]
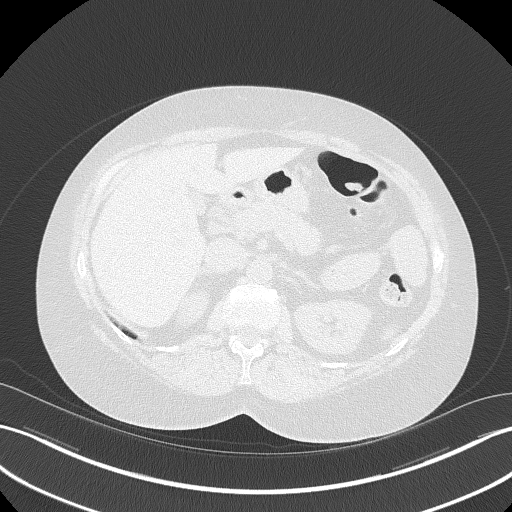
[im 48/312  lung]
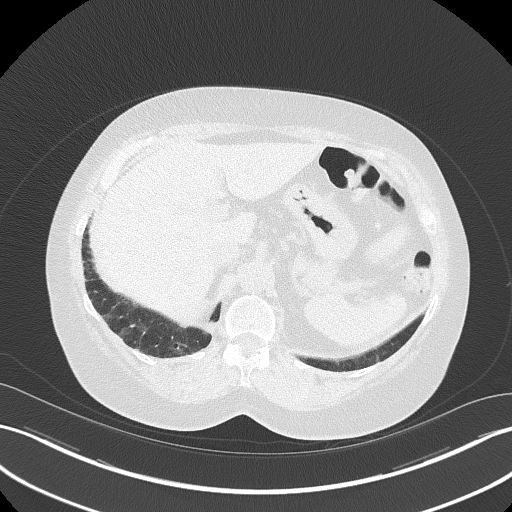
[im 72/312  lung]
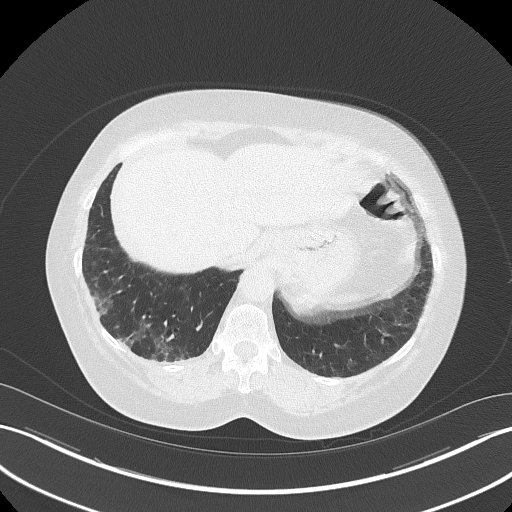
[im 96/312  lung]
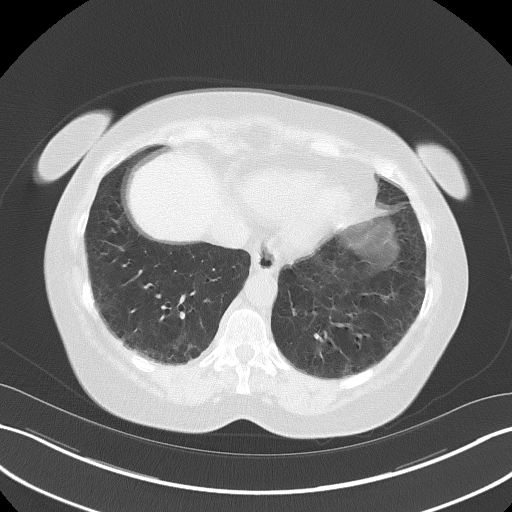
[im 120/312  mediastinal]
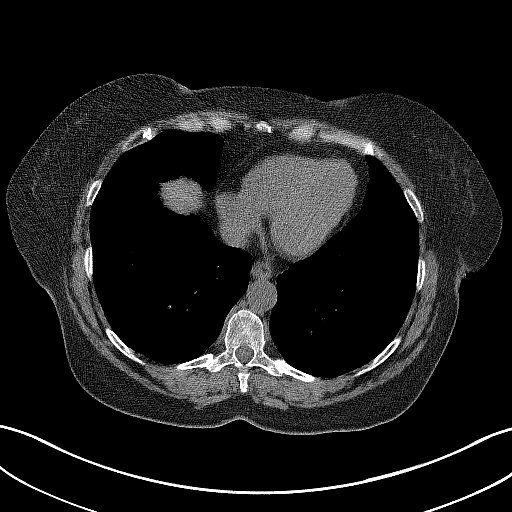
[im 120/312  lung]
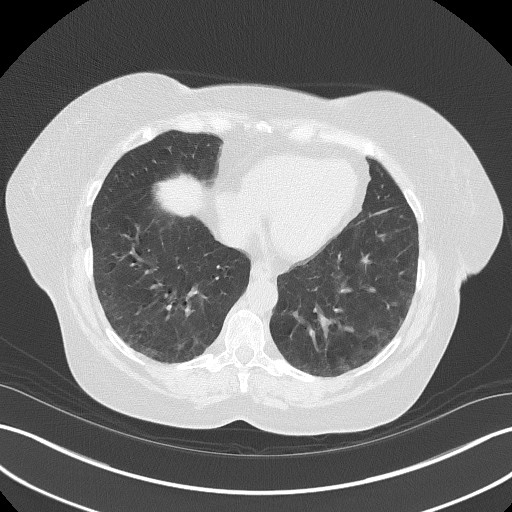
[im 144/312  lung]
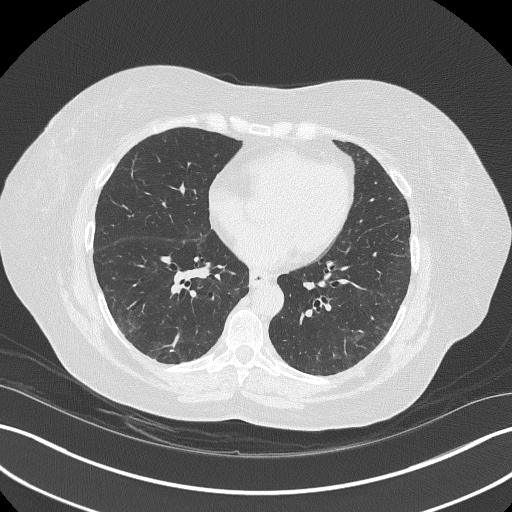
[im 168/312  lung]
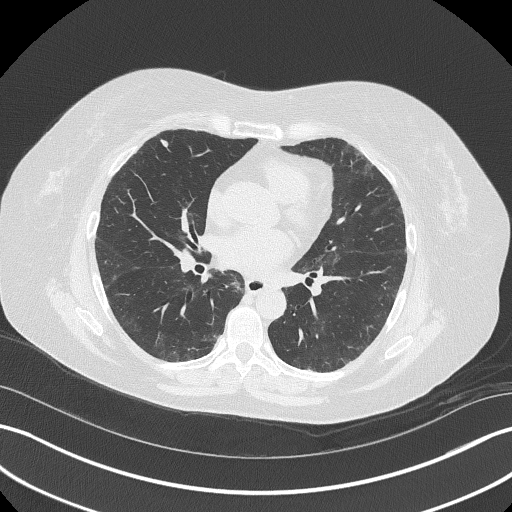
[im 192/312  lung]
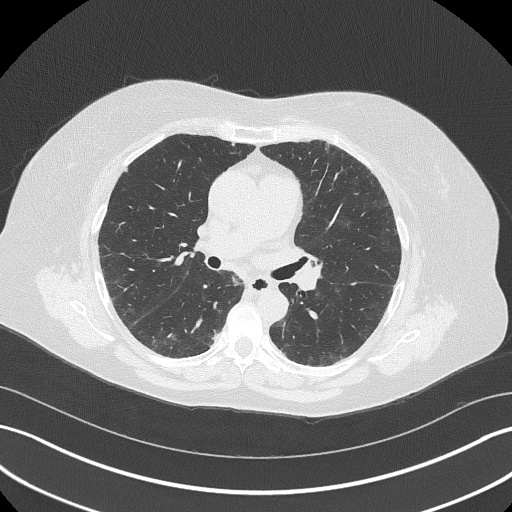
[im 216/312  mediastinal]
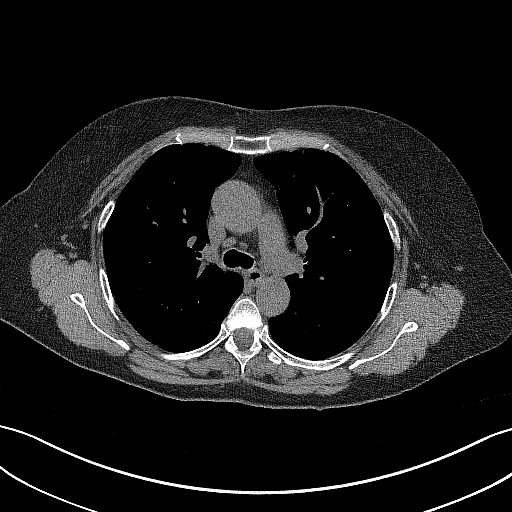
[im 216/312  lung]
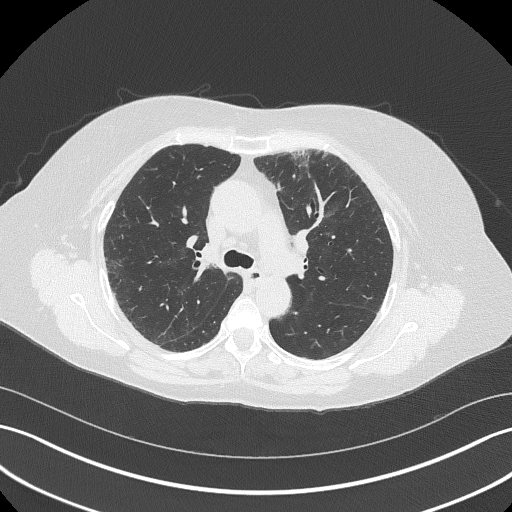
[im 240/312  lung]
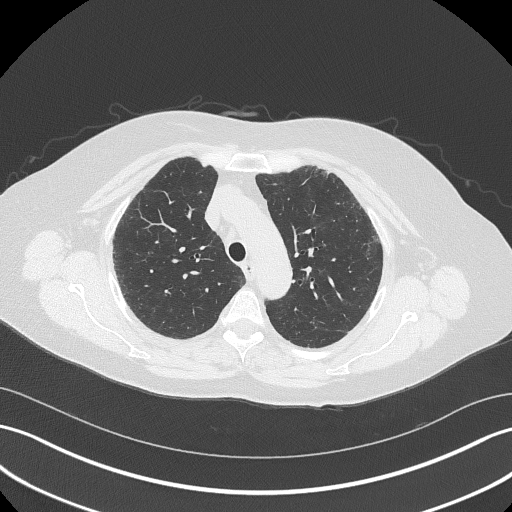
[im 264/312  lung]
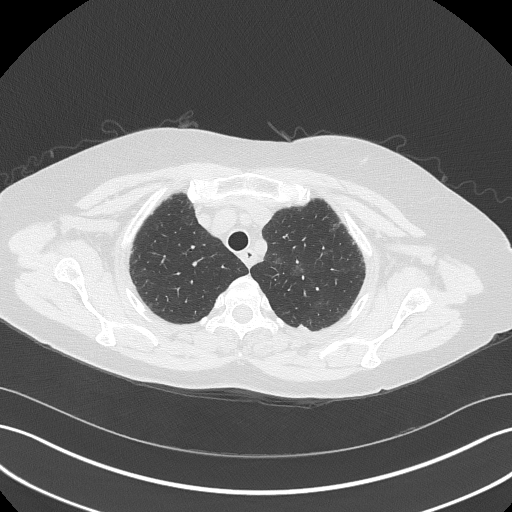
[im 288/312  lung]
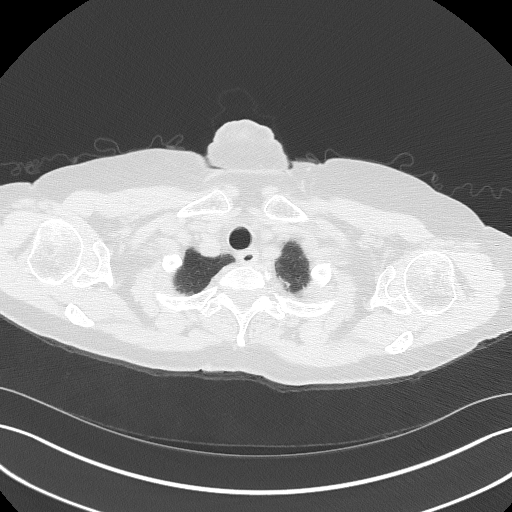

[15 of 36 positions shown; findings below may reference images not displayed]

FINDINGS: Cardiovascular: Aortic atherosclerosis. Normal heart size. No
pericardial effusion.

Mediastinum/Nodes: No enlarged mediastinal, hilar, or axillary lymph
nodes. Small hiatal hernia. Thyroid gland, trachea, and esophagus
demonstrate no significant findings.

Lungs/Pleura: There is mild, peripheral irregular and ground-glass
opacity throughout the lungs, with a slight apical to basal
gradient. There is no significant bronchiectasis, subpleural
bronchiolectasis, or evidence of honeycombing. Multiple small
bilateral pulmonary nodules are stable and definitively benign, for
example a 5 mm nodule of the anterior left upper lobe (series 11,
image 101). No significant air trapping on expiratory phase imaging.
No pleural effusion or pneumothorax.

Upper Abdomen: No acute abnormality.

Musculoskeletal: No chest wall mass or suspicious bone lesions
identified.
IMPRESSION: 1. There is mild, peripheral irregular and ground-glass opacity
throughout the lungs, with a slight apical to basal gradient. There
is no significant bronchiectasis, subpleural bronchiolectasis, or
evidence of honeycombing. Findings are unchanged compared to prior
examination and most consistent with an "alternative diagnosis"
pattern by ATS pulmonary fibrosis criteria. Differential
considerations primarily include fibrotic NSIP.
2. Multiple small bilateral pulmonary nodules measuring 5 mm or
smaller are stable and definitively benign. No further follow-up is
required.

Aortic Atherosclerosis ([id number]).

## 2022-10-17 DIAGNOSIS — R519 Headache, unspecified: Secondary | ICD-10-CM | POA: Diagnosis not present

## 2022-10-17 DIAGNOSIS — Z880 Allergy status to penicillin: Secondary | ICD-10-CM | POA: Diagnosis not present

## 2022-10-17 DIAGNOSIS — R42 Dizziness and giddiness: Secondary | ICD-10-CM | POA: Diagnosis not present

## 2022-10-17 DIAGNOSIS — R059 Cough, unspecified: Secondary | ICD-10-CM | POA: Diagnosis not present

## 2022-10-17 DIAGNOSIS — J069 Acute upper respiratory infection, unspecified: Secondary | ICD-10-CM | POA: Diagnosis not present

## 2022-10-17 DIAGNOSIS — Z881 Allergy status to other antibiotic agents status: Secondary | ICD-10-CM | POA: Diagnosis not present

## 2022-10-17 DIAGNOSIS — R051 Acute cough: Secondary | ICD-10-CM | POA: Diagnosis not present

## 2022-10-17 DIAGNOSIS — Z20822 Contact with and (suspected) exposure to covid-19: Secondary | ICD-10-CM | POA: Diagnosis not present

## 2022-10-17 DIAGNOSIS — Z882 Allergy status to sulfonamides status: Secondary | ICD-10-CM | POA: Diagnosis not present

## 2023-04-17 DIAGNOSIS — F411 Generalized anxiety disorder: Secondary | ICD-10-CM | POA: Diagnosis not present

## 2023-04-17 DIAGNOSIS — Z6826 Body mass index (BMI) 26.0-26.9, adult: Secondary | ICD-10-CM | POA: Diagnosis not present

## 2023-04-17 DIAGNOSIS — G4700 Insomnia, unspecified: Secondary | ICD-10-CM | POA: Diagnosis not present

## 2023-04-17 DIAGNOSIS — R030 Elevated blood-pressure reading, without diagnosis of hypertension: Secondary | ICD-10-CM | POA: Diagnosis not present

## 2023-05-19 DIAGNOSIS — F419 Anxiety disorder, unspecified: Secondary | ICD-10-CM | POA: Diagnosis not present

## 2023-05-19 DIAGNOSIS — F330 Major depressive disorder, recurrent, mild: Secondary | ICD-10-CM | POA: Diagnosis not present

## 2023-06-05 DIAGNOSIS — F419 Anxiety disorder, unspecified: Secondary | ICD-10-CM | POA: Diagnosis not present

## 2023-06-05 DIAGNOSIS — R5383 Other fatigue: Secondary | ICD-10-CM | POA: Diagnosis not present

## 2023-06-05 DIAGNOSIS — R030 Elevated blood-pressure reading, without diagnosis of hypertension: Secondary | ICD-10-CM | POA: Diagnosis not present

## 2023-06-05 DIAGNOSIS — F330 Major depressive disorder, recurrent, mild: Secondary | ICD-10-CM | POA: Diagnosis not present

## 2023-06-05 DIAGNOSIS — Z6826 Body mass index (BMI) 26.0-26.9, adult: Secondary | ICD-10-CM | POA: Diagnosis not present

## 2023-06-05 DIAGNOSIS — E039 Hypothyroidism, unspecified: Secondary | ICD-10-CM | POA: Diagnosis not present

## 2023-06-15 DIAGNOSIS — Z6826 Body mass index (BMI) 26.0-26.9, adult: Secondary | ICD-10-CM | POA: Diagnosis not present

## 2023-06-15 DIAGNOSIS — Z1212 Encounter for screening for malignant neoplasm of rectum: Secondary | ICD-10-CM | POA: Diagnosis not present

## 2023-06-15 DIAGNOSIS — R030 Elevated blood-pressure reading, without diagnosis of hypertension: Secondary | ICD-10-CM | POA: Diagnosis not present

## 2023-06-15 DIAGNOSIS — K921 Melena: Secondary | ICD-10-CM | POA: Diagnosis not present

## 2023-06-15 DIAGNOSIS — K649 Unspecified hemorrhoids: Secondary | ICD-10-CM | POA: Diagnosis not present

## 2023-06-21 ENCOUNTER — Encounter (INDEPENDENT_AMBULATORY_CARE_PROVIDER_SITE_OTHER): Payer: Self-pay | Admitting: *Deleted

## 2023-06-21 DIAGNOSIS — F419 Anxiety disorder, unspecified: Secondary | ICD-10-CM | POA: Diagnosis not present

## 2023-06-21 DIAGNOSIS — F330 Major depressive disorder, recurrent, mild: Secondary | ICD-10-CM | POA: Diagnosis not present

## 2023-07-17 ENCOUNTER — Telehealth (INDEPENDENT_AMBULATORY_CARE_PROVIDER_SITE_OTHER): Payer: Self-pay | Admitting: Gastroenterology

## 2023-07-17 NOTE — Telephone Encounter (Signed)
Pt left voicemail needing to reschedule her colonoscopy for 07/22/23. 07/22/23 is a Sunday and pt is on for Office Visit on 07/23/23 with Dr.Castaneda. Pt states she will be out of town.

## 2023-07-19 DIAGNOSIS — E039 Hypothyroidism, unspecified: Secondary | ICD-10-CM | POA: Diagnosis not present

## 2023-07-23 ENCOUNTER — Ambulatory Visit (INDEPENDENT_AMBULATORY_CARE_PROVIDER_SITE_OTHER): Payer: Medicare HMO | Admitting: Gastroenterology

## 2023-07-23 DIAGNOSIS — M19071 Primary osteoarthritis, right ankle and foot: Secondary | ICD-10-CM | POA: Diagnosis not present

## 2023-07-23 DIAGNOSIS — M79661 Pain in right lower leg: Secondary | ICD-10-CM | POA: Diagnosis not present

## 2023-07-23 DIAGNOSIS — Z043 Encounter for examination and observation following other accident: Secondary | ICD-10-CM | POA: Diagnosis not present

## 2023-07-23 DIAGNOSIS — M7731 Calcaneal spur, right foot: Secondary | ICD-10-CM | POA: Diagnosis not present

## 2023-07-23 DIAGNOSIS — M79671 Pain in right foot: Secondary | ICD-10-CM | POA: Diagnosis not present

## 2023-07-23 DIAGNOSIS — M25571 Pain in right ankle and joints of right foot: Secondary | ICD-10-CM | POA: Diagnosis not present

## 2023-07-24 DIAGNOSIS — F1220 Cannabis dependence, uncomplicated: Secondary | ICD-10-CM | POA: Diagnosis not present

## 2023-07-24 DIAGNOSIS — F330 Major depressive disorder, recurrent, mild: Secondary | ICD-10-CM | POA: Diagnosis not present

## 2023-07-24 DIAGNOSIS — F419 Anxiety disorder, unspecified: Secondary | ICD-10-CM | POA: Diagnosis not present

## 2023-07-31 ENCOUNTER — Ambulatory Visit (INDEPENDENT_AMBULATORY_CARE_PROVIDER_SITE_OTHER): Payer: Medicare HMO | Admitting: Gastroenterology

## 2023-08-02 DIAGNOSIS — M25571 Pain in right ankle and joints of right foot: Secondary | ICD-10-CM | POA: Diagnosis not present

## 2023-08-02 DIAGNOSIS — R030 Elevated blood-pressure reading, without diagnosis of hypertension: Secondary | ICD-10-CM | POA: Diagnosis not present

## 2023-08-02 DIAGNOSIS — G4700 Insomnia, unspecified: Secondary | ICD-10-CM | POA: Diagnosis not present

## 2023-08-02 DIAGNOSIS — F411 Generalized anxiety disorder: Secondary | ICD-10-CM | POA: Diagnosis not present

## 2023-08-02 DIAGNOSIS — Z6827 Body mass index (BMI) 27.0-27.9, adult: Secondary | ICD-10-CM | POA: Diagnosis not present

## 2023-08-14 ENCOUNTER — Ambulatory Visit (INDEPENDENT_AMBULATORY_CARE_PROVIDER_SITE_OTHER): Payer: Medicare PPO | Admitting: Gastroenterology

## 2023-08-14 ENCOUNTER — Encounter (INDEPENDENT_AMBULATORY_CARE_PROVIDER_SITE_OTHER): Payer: Self-pay | Admitting: Gastroenterology

## 2023-08-14 VITALS — BP 154/88 | HR 69 | Temp 97.9°F | Ht 60.0 in | Wt 139.3 lb

## 2023-08-14 DIAGNOSIS — I10 Essential (primary) hypertension: Secondary | ICD-10-CM | POA: Diagnosis not present

## 2023-08-14 DIAGNOSIS — K921 Melena: Secondary | ICD-10-CM | POA: Insufficient documentation

## 2023-08-14 DIAGNOSIS — K5904 Chronic idiopathic constipation: Secondary | ICD-10-CM | POA: Diagnosis not present

## 2023-08-14 NOTE — Progress Notes (Signed)
Vista Lawman , M.D. Gastroenterology & Hepatology [facility] Gastroenterology [address] [place], [place] [address] Primary Care Physician: Lawerance Sabal, [place] [address] [place] [address]  Chief Complaint: Hematochezia  History of Present Illness: Anne Martinez is a 67 y.o. adult with no significant medical problems ( found to have HTN in clinic)  who presents for evaluation of painless hematochezia and constipation.  Patient reports episode started 3 months ago when she noticed fresh blood dripping on the toilet bowel.  This subsided with additional few episodes since then.  Patient used to bring [facility] 5 to 6 cans a day which she has quit now. The patient denies having any nausea, vomiting, fever, chills, melena, hematemesis, abdominal distention, abdominal pain, diarrhea, jaundice, pruritus or weight loss.  Last UJW:JXBJ Last Colonoscopy: 1996 in [place]  FHx: neg for any gastrointestinal/liver disease, no malignancies Social: neg smoking, alcohol or illicit drug use Surgical: no abdominal surgeries  Past Medical History: Past Medical History:  Diagnosis Date   Anxiety    History of pneumonia     Past Surgical History: Past Surgical History:  Procedure Laterality Date   BLADDER SUSPENSION     ENDOMETRIAL ABLATION      Family History: Family History  Problem Relation Age of Onset   Hypertension Mother    Heart disease Mother    Heart attack Father     Social History: Social History   Tobacco Use  Smoking Status Never  Smokeless Tobacco Never   Social History   Substance and Sexual Activity  Alcohol Use No   Social History   Substance and Sexual Activity  Drug Use Yes   Types: Marijuana   Comment: 1/2 ounce per wk    Allergies: Allergies  Allergen Reactions   Ceftin [Cefuroxime]    Penicillins     Makes her pass out. Has patient had a PCN reaction causing immediate rash, facial/tongue/throat  swelling, SOB or lightheadedness with hypotension: No Has patient had a PCN reaction causing severe rash involving mucus membranes or skin necrosis: No Has patient had a PCN reaction that required hospitalization No Has patient had a PCN reaction occurring within the last 10 years: No If all of the above answers are "NO", then may proceed with Cephalosporin use.     Sulfa Antibiotics     Medications: Current Outpatient Medications  Medication Sig Dispense Refill   levothyroxine (SYNTHROID) 50 MCG tablet Take 50 mcg by mouth daily.     No current facility-administered medications for this visit.    Review of Systems: GENERAL: negative for malaise, night sweats HEENT: No changes in hearing or vision, no nose bleeds or other nasal problems. NECK: Negative for lumps, goiter, pain and significant neck swelling RESPIRATORY: Negative for cough, wheezing CARDIOVASCULAR: Negative for chest pain, leg swelling, palpitations, orthopnea GI: SEE HPI MUSCULOSKELETAL: Negative for joint pain or swelling, back pain, and muscle pain. SKIN: Negative for lesions, rash HEMATOLOGY Negative for prolonged bleeding, bruising easily, and swollen nodes. ENDOCRINE: Negative for cold or heat intolerance, polyuria, polydipsia and goiter. NEURO: negative for tremor, gait imbalance, syncope and seizures. The remainder of the review of systems is noncontributory.   Physical Exam: BP (!) 144/81 (BP Location: Left Arm, Patient Position: Sitting, Cuff Size: Normal)   Pulse 69   Temp 97.9 F (36.6 C) (Oral)   Ht 5' (1.524 m)   Wt 139 lb 4.8 oz (63.2 kg)   BMI 27.21 kg/m  GENERAL: The patient  is AO x3, in no acute distress. HEENT: Head is normocephalic and atraumatic. EOMI are intact. Mouth is well hydrated and without lesions. NECK: Supple. No masses LUNGS: Clear to auscultation. No presence of rhonchi/wheezing/rales. Adequate chest expansion HEART: RRR, normal s1 and s2. ABDOMEN: Soft, nontender, no  guarding, no peritoneal signs, and nondistended. BS +. No masses.  EXTREMITIES: Without any cyanosis, clubbing, rash, lesions or edema. NEUROLOGIC: AOx3, no focal motor deficit. SKIN: no jaundice, no rashes   Imaging/Labs: as above     Latest Ref Rng & Units 07/26/2016    1:35 PM  CBC  WBC 4.0 - 10.5 K/uL 9.3   Hemoglobin 12.0 - 15.0 g/dL 40.9   Hematocrit 81.1 - 46.0 % 40.9   Platelets 150 - 400 K/uL 290    No results found for: "IRON", "TIBC", "FERRITIN"  I personally reviewed and interpreted the available labs, imaging and endoscopic files.  Lab work with normal liver enzymes hemoglobin 12 MCV 87 TSH 10 Impression and Plan:  Anne Martinez is a 67 y.o. adult with no significant medical problems ( found to have HTN in clinic)  who presents for evaluation of painless hematochezia and constipation.  #Painless hematochezia  This could be hemorrhoidal bleed but need to rule out polyps or malignancy  Last colonoscopy 1996  Proceed with colonoscopy has hematochezia is considered an alarm symptom  #Constipation Patient has occasional constipation with hard stool  Ensure adequate fluid intake: Aim for 8 glasses of water daily. Follow a high fiber diet: Include foods such as dates, prunes, pears, and kiwi. Take Miralax twice a day for the first week, then reduce to once daily thereafter. Use Metamucil twice a day.  #Hypertension  The patient was found to have elevated blood pressure when vital signs were checked in the office. The blood pressure was rechecked by the nursing staff and it was found be persistently elevated >140/90 mmHg. I personally advised to the patient to follow up closely with PCP for hypertension control.    All questions were answered.      Vista Lawman, MD Gastroenterology and Hepatology [facility] Gastroenterology   This chart has been completed using [facility] Dictation software, and while attempts have been made to  ensure accuracy , certain words and phrases may not be transcribed as intended

## 2023-08-14 NOTE — Patient Instructions (Signed)
It was very nice to meet you today, as dicussed with will plan for the following :  1) Colonoscopy

## 2023-10-15 ENCOUNTER — Telehealth (INDEPENDENT_AMBULATORY_CARE_PROVIDER_SITE_OTHER): Payer: Self-pay | Admitting: *Deleted

## 2023-10-15 MED ORDER — PEG 3350-KCL-NA BICARB-NACL 420 G PO SOLR: 4000.0000 mL | Freq: Once | ORAL | 0 refills | Status: AC

## 2023-10-15 NOTE — Telephone Encounter (Signed)
Spoke with pt. Scheduled or 12/6. Aware will send instructions via mychart and rx for prep to pharmacy. Confirmed address and pharmacy [facility] pharmacy in [place]).   PA approved via cohere Authorization #[id number]  DOS: 11/09/2023 - 12/04/2023

## 2023-10-24 ENCOUNTER — Telehealth (INDEPENDENT_AMBULATORY_CARE_PROVIDER_SITE_OTHER): Payer: Self-pay | Admitting: Gastroenterology
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
## 2023-10-24 NOTE — Telephone Encounter (Signed)
Thanks

## 2023-10-30 DIAGNOSIS — N390 Urinary tract infection, site not specified: Secondary | ICD-10-CM | POA: Diagnosis not present

## 2023-10-30 DIAGNOSIS — R300 Dysuria: Secondary | ICD-10-CM | POA: Diagnosis not present

## 2023-11-07 DIAGNOSIS — Z6826 Body mass index (BMI) 26.0-26.9, adult: Secondary | ICD-10-CM | POA: Diagnosis not present

## 2023-11-07 DIAGNOSIS — M79671 Pain in right foot: Secondary | ICD-10-CM | POA: Diagnosis not present

## 2023-11-07 DIAGNOSIS — M19071 Primary osteoarthritis, right ankle and foot: Secondary | ICD-10-CM | POA: Diagnosis not present

## 2023-11-09 ENCOUNTER — Encounter (HOSPITAL_COMMUNITY): Payer: Self-pay

## 2023-11-09 ENCOUNTER — Ambulatory Visit (HOSPITAL_COMMUNITY): Admit: 2023-11-09 | Payer: Medicare PPO | Admitting: Gastroenterology

## 2023-11-09 SURGERY — COLONOSCOPY WITH PROPOFOL
Anesthesia: Monitor Anesthesia Care

## 2023-12-06 ENCOUNTER — Telehealth (INDEPENDENT_AMBULATORY_CARE_PROVIDER_SITE_OTHER): Payer: Self-pay | Admitting: Gastroenterology

## 2023-12-06 MED ORDER — PEG 3350-KCL-NA BICARB-NACL 420 G PO SOLR: 4000.0000 mL | Freq: Once | ORAL | 0 refills | Status: AC

## 2023-12-06 NOTE — Telephone Encounter (Signed)
 Pt called in and reschedule TCS to 12/21/23. Will send prep to [facility] and mail instructions. PA approved via cohere

## 2023-12-21 ENCOUNTER — Other Ambulatory Visit: Payer: Self-pay

## 2023-12-21 ENCOUNTER — Ambulatory Visit (HOSPITAL_COMMUNITY)
Admission: RE | Admit: 2023-12-21 | Discharge: 2023-12-21 | Disposition: A | Discharge status: Home / Self Care | Payer: Medicare PPO | Source: Ambulatory Visit | Attending: Gastroenterology | Admitting: Gastroenterology

## 2023-12-21 ENCOUNTER — Ambulatory Visit (HOSPITAL_BASED_OUTPATIENT_CLINIC_OR_DEPARTMENT_OTHER): Payer: Medicare PPO | Admitting: Anesthesiology

## 2023-12-21 ENCOUNTER — Encounter (HOSPITAL_COMMUNITY)
Admission: RE | Disposition: A | Discharge status: Home / Self Care | Payer: Self-pay | Source: Ambulatory Visit | Attending: Gastroenterology

## 2023-12-21 ENCOUNTER — Ambulatory Visit (HOSPITAL_COMMUNITY): Payer: Medicare PPO | Admitting: Anesthesiology

## 2023-12-21 ENCOUNTER — Encounter (HOSPITAL_COMMUNITY): Payer: Self-pay | Admitting: Gastroenterology

## 2023-12-21 DIAGNOSIS — I10 Essential (primary) hypertension: Secondary | ICD-10-CM | POA: Diagnosis not present

## 2023-12-21 DIAGNOSIS — D123 Benign neoplasm of transverse colon: Secondary | ICD-10-CM

## 2023-12-21 DIAGNOSIS — K635 Polyp of colon: Secondary | ICD-10-CM | POA: Diagnosis not present

## 2023-12-21 DIAGNOSIS — K625 Hemorrhage of anus and rectum: Secondary | ICD-10-CM

## 2023-12-21 DIAGNOSIS — Z800 Family history of malignant neoplasm of digestive organs: Secondary | ICD-10-CM | POA: Insufficient documentation

## 2023-12-21 DIAGNOSIS — K5900 Constipation, unspecified: Secondary | ICD-10-CM | POA: Diagnosis not present

## 2023-12-21 DIAGNOSIS — D124 Benign neoplasm of descending colon: Secondary | ICD-10-CM | POA: Diagnosis not present

## 2023-12-21 DIAGNOSIS — J449 Chronic obstructive pulmonary disease, unspecified: Secondary | ICD-10-CM | POA: Insufficient documentation

## 2023-12-21 DIAGNOSIS — K648 Other hemorrhoids: Secondary | ICD-10-CM

## 2023-12-21 DIAGNOSIS — D122 Benign neoplasm of ascending colon: Secondary | ICD-10-CM | POA: Insufficient documentation

## 2023-12-21 HISTORY — PX: HEMOSTASIS CLIP PLACEMENT: SHX6857

## 2023-12-21 HISTORY — PX: POLYPECTOMY: SHX149

## 2023-12-21 HISTORY — PX: COLONOSCOPY WITH PROPOFOL: SHX5780

## 2023-12-21 HISTORY — PX: SCLEROTHERAPY: SHX6841

## 2023-12-21 SURGERY — COLONOSCOPY WITH PROPOFOL
Anesthesia: General

## 2023-12-21 MED ORDER — SODIUM CHLORIDE 0.9% FLUSH: 3.0000 mL | INTRAVENOUS | Status: DC | PRN

## 2023-12-21 MED ORDER — PROPOFOL 10 MG/ML IV BOLUS
INTRAVENOUS | Status: DC | PRN
  Administered 2023-12-21: 50 mg via INTRAVENOUS
  Administered 2023-12-21: 60 mg via INTRAVENOUS
  Administered 2023-12-21: 50 mg via INTRAVENOUS
  Administered 2023-12-21: 100 mg via INTRAVENOUS

## 2023-12-21 MED ORDER — PROPOFOL 500 MG/50ML IV EMUL
INTRAVENOUS | Status: DC | PRN
  Administered 2023-12-21: 150 ug/kg/min via INTRAVENOUS

## 2023-12-21 MED ORDER — LIDOCAINE HCL (PF) 2 % IJ SOLN
INTRAMUSCULAR | Status: DC | PRN
  Administered 2023-12-21: 50 mg via INTRADERMAL

## 2023-12-21 MED ORDER — PHENYLEPHRINE 80 MCG/ML (10ML) SYRINGE FOR IV PUSH (FOR BLOOD PRESSURE SUPPORT)
PREFILLED_SYRINGE | INTRAVENOUS | Status: AC
  Filled 2023-12-21: qty 10

## 2023-12-21 MED ORDER — LACTATED RINGERS IV SOLN: INTRAVENOUS | Status: DC | PRN

## 2023-12-21 MED ORDER — SODIUM CHLORIDE 0.9% FLUSH: 3.0000 mL | Freq: Two times a day (BID) | INTRAVENOUS | Status: DC

## 2023-12-21 MED ORDER — PHENYLEPHRINE 80 MCG/ML (10ML) SYRINGE FOR IV PUSH (FOR BLOOD PRESSURE SUPPORT)
PREFILLED_SYRINGE | INTRAVENOUS | Status: DC | PRN
  Administered 2023-12-21 (×4): 160 ug via INTRAVENOUS

## 2023-12-21 NOTE — Op Note (Signed)
[facility] Patient Name: Silah Kerchner Procedure Date: 12/21/2023 1:04 PM MRN: [id number] Date of Birth: 06-29-1956 Attending MD: Katrinka Blazing , , [id number] CSN: [id number] Age: 68 Admit Type: Outpatient Procedure:                Colonoscopy Indications:              Rectal bleeding Providers:                Katrinka Blazing, Crystal Page, Pandora Leiter,                            Technician Referring MD:              Medicines:                Monitored Anesthesia Care Complications:            No immediate complications. Estimated Blood Loss:     Estimated blood loss: none. Procedure:                Pre-Anesthesia Assessment:                           - Prior to the procedure, a History and Physical                            was performed, and patient medications, allergies                            and sensitivities were reviewed. The patient's                            tolerance of previous anesthesia was reviewed.                           - The risks and benefits of the procedure and the                            sedation options and risks were discussed with the                            patient. All questions were answered and informed                            consent was obtained.                           - ASA Grade Assessment: I - A normal, healthy                            patient.                           After obtaining informed consent, the colonoscope                            was passed under direct vision. Throughout the  procedure, the patient's blood pressure, pulse, and                            oxygen saturations were monitored continuously. The                            [id number]-HQ190L ([id number]) scope was introduced through                            the anus and advanced to the the cecum, identified                            by appendiceal orifice and ileocecal valve. The                            colonoscopy was  performed without difficulty. The                            patient tolerated the procedure well. The quality                            of the bowel preparation was excellent. Scope In: 1:07:44 PM Scope Out: 1:39:46 PM Scope Withdrawal Time: 0 hours 23 minutes 46 seconds  Total Procedure Duration: 0 hours 32 minutes 2 seconds  Findings:      The perianal and digital rectal examinations were normal.      A 12 mm polyp was found in the ascending colon. The polyp was       semi-sessile. Area was successfully injected with 4 mL Eleview for a       lift polypectomy. Imaging was performed using white light and narrow       band imaging to visualize the mucosa and demarcate the polyp site after       injection for EMR purposes. The polyp was removed with a hot snare.       Resection and retrieval were complete. To prevent bleeding after the       polypectomy, one hemostatic clip was successfully placed (MR safe). Clip       manufacturer: [employer]. There was no bleeding at the end of the       procedure.      A 5 mm polyp was found in the transverse colon. The polyp was sessile.       The polyp was removed with a cold snare. Resection and retrieval were       complete.      Four sessile and semi-sessile polyps were found in the descending colon.       The polyps were 2 to 6 mm in size. These polyps were removed with a cold       snare. Resection and retrieval were complete.      Non-bleeding internal hemorrhoids were found during retroflexion. The       hemorrhoids were medium-sized. Impression:               - One 12 mm polyp in the ascending colon, removed                            with a  hot snare. Resected and retrieved. Injected.                            Clip manufacturer: [employer]. Clip (MR                            safe) was placed.                           - One 5 mm polyp in the transverse colon, removed                            with a cold snare. Resected  and retrieved.                           - Four 2 to 6 mm polyps in the descending colon,                            removed with a cold snare. Resected and retrieved.                           - Non-bleeding internal hemorrhoids. Moderate Sedation:      Per Anesthesia Care Recommendation:           - Discharge patient to home (ambulatory).                           - Resume previous diet.                           - Await pathology results.                           - Repeat colonoscopy in 3 years for surveillance. Procedure Code(s):        --- Professional ---                           [phone], 59, Colonoscopy, flexible; with removal of                            tumor(s), polyp(s), or other lesion(s) by snare                            technique                           45381, Colonoscopy, flexible; with directed                            submucosal injection(s), any substance Diagnosis Code(s):        --- Professional ---                           D12.2, Benign neoplasm of ascending colon  D12.3, Benign neoplasm of transverse colon (hepatic                            flexure or splenic flexure)                           D12.4, Benign neoplasm of descending colon                           K64.8, Other hemorrhoids                           K62.5, Hemorrhage of anus and rectum CPT copyright 2022 American Medical Association. All rights reserved. The codes documented in this report are preliminary and upon coder review may  be revised to meet current compliance requirements. Katrinka Blazing, MD Katrinka Blazing,  12/21/2023 1:48:09 PM This report has been signed electronically. Number of Addenda: 0

## 2023-12-21 NOTE — Anesthesia Preprocedure Evaluation (Signed)
Anesthesia Evaluation  Patient identified by MRN, date of birth, ID band Patient awake    Reviewed: Allergy & Precautions, H&P , NPO status , Patient's Chart, lab work & pertinent test results, reviewed documented beta blocker date and time   Airway Mallampati: II  TM Distance: >3 FB Neck ROM: full    Dental no notable dental hx.    Pulmonary pneumonia, resolved, COPD   Pulmonary exam normal breath sounds clear to auscultation       Cardiovascular Exercise Tolerance: Good hypertension,  Rhythm:regular Rate:Normal     Neuro/Psych   Anxiety     negative neurological ROS     GI/Hepatic negative GI ROS, Neg liver ROS,,,  Endo/Other  negative endocrine ROS    Renal/GU negative Renal ROS  negative genitourinary   Musculoskeletal   Abdominal   Peds  Hematology negative hematology ROS (+)   Anesthesia Other Findings   Reproductive/Obstetrics negative OB ROS                             Anesthesia Physical Anesthesia Plan  ASA: 4  Anesthesia Plan: General   Post-op Pain Management: Minimal or no pain anticipated   Induction:   PONV Risk Score and Plan: Propofol infusion  Airway Management Planned: Natural Airway and Nasal Cannula  Additional Equipment: None  Intra-op Plan:   Post-operative Plan:   Informed Consent: I have reviewed the patients History and Physical, chart, labs and discussed the procedure including the risks, benefits and alternatives for the proposed anesthesia with the patient or authorized representative who has indicated his/her understanding and acceptance.     Dental Advisory Given  Plan Discussed with: CRNA  Anesthesia Plan Comments:         Anesthesia Quick Evaluation

## 2023-12-21 NOTE — Anesthesia Procedure Notes (Signed)
Date/Time: 12/21/2023 1:03 PM  Performed by: Julian Reil, CRNAPre-anesthesia Checklist: Patient identified, Emergency Drugs available, Suction available and Patient being monitored Patient Re-evaluated:Patient Re-evaluated prior to induction Oxygen Delivery Method: Nasal cannula Induction Type: IV induction Placement Confirmation: positive ETCO2

## 2023-12-21 NOTE — Transfer of Care (Signed)
Immediate Anesthesia Transfer of Care Note  Patient: Anne Martinez  Procedure(s) Performed: COLONOSCOPY WITH PROPOFOL POLYPECTOMY INTESTINAL SCLEROTHERAPY HEMOSTASIS CLIP PLACEMENT  Patient Location: Endoscopy Unit  Anesthesia Type:General  Level of Consciousness: drowsy  Airway & Oxygen Therapy: Patient Spontanous Breathing  Post-op Assessment: Report given to RN and Post -op Vital signs reviewed and stable  Post vital signs: Reviewed and stable  Last Vitals:  Vitals Value Taken Time  BP    Temp    Pulse    Resp    SpO2      Last Pain:  Vitals:   12/21/23 1340  TempSrc: Oral  PainSc:       Patients Stated Pain Goal: 8 (12/21/23 1247)  Complications: No notable events documented.

## 2023-12-21 NOTE — H&P (Signed)
Anne Martinez is an 68 y.o. adult.   Chief Complaint: Constipation HPI: Anne Martinez is a 68 y.o. adult with no significant medical problems ( found to have HTN in clinic)  who presents for evaluation of painless hematochezia and constipation.   Pt states she had rectal bleeding fo two days during the summer time, but this has nor recurred since then.  Has 2 sisters that were recently diagnosed with rectal cancer.  Both were diagnosed in their 70s.  The patient denies having any nausea, vomiting, fever, chills, hematochezia, melena, hematemesis, abdominal distention, abdominal pain, diarrhea, jaundice, pruritus or weight loss.   Past Medical History:  Diagnosis Date   Anxiety    History of pneumonia     Past Surgical History:  Procedure Laterality Date   BLADDER SUSPENSION     ENDOMETRIAL ABLATION      Family History  Problem Relation Age of Onset   Hypertension Mother    Heart disease Mother    Heart attack Father    Social History:  reports that he has never smoked. He has never used smokeless tobacco. He reports current alcohol use. He reports current drug use. Drug: Marijuana.  Allergies:  Allergies  Allergen Reactions   Ceftin [Cefuroxime]    Penicillins     Makes her pass out. Has patient had a PCN reaction causing immediate rash, facial/tongue/throat swelling, SOB or lightheadedness with hypotension: No Has patient had a PCN reaction causing severe rash involving mucus membranes or skin necrosis: No Has patient had a PCN reaction that required hospitalization No Has patient had a PCN reaction occurring within the last 10 years: No If all of the above answers are "NO", then may proceed with Cephalosporin use.     Sulfa Antibiotics     Medications Prior to Admission  Medication Sig Dispense Refill   ibuprofen (ADVIL) 200 MG tablet Take 200 mg by mouth every 6 (six) hours as needed for mild pain (pain score 1-3).     levothyroxine (SYNTHROID) 50 MCG tablet Take  50 mcg by mouth daily.      No results found for this or any previous visit (from the past 48 hours). No results found.  Review of Systems  Blood pressure 137/65, pulse 64, temperature 98.1 F (36.7 C), temperature source Oral, resp. rate 19, height 5' (1.524 m), weight 67.6 kg, SpO2 99%. Physical Exam  GENERAL: The patient is AO x3, in no acute distress. HEENT: Head is normocephalic and atraumatic. EOMI are intact. Mouth is well hydrated and without lesions. NECK: Supple. No masses LUNGS: Clear to auscultation. No presence of rhonchi/wheezing/rales. Adequate chest expansion HEART: RRR, normal s1 and s2. ABDOMEN: Soft, nontender, no guarding, no peritoneal signs, and nondistended. BS +. No masses. EXTREMITIES: Without any cyanosis, clubbing, rash, lesions or edema. NEUROLOGIC: AOx3, no focal motor deficit. SKIN: no jaundice, no rashes  Assessment/Plan Anne Martinez is a 68 y.o. adult with no significant medical problems ( found to have HTN in clinic)  who presents for evaluation of painless hematochezia and constipation.  We will proceed with colonoscopy.  Dolores Frame, MD 12/21/2023, 12:56 PM

## 2023-12-21 NOTE — Anesthesia Postprocedure Evaluation (Signed)
Anesthesia Post Note  Patient: Anne Martinez  Procedure(s) Performed: COLONOSCOPY WITH PROPOFOL POLYPECTOMY INTESTINAL SCLEROTHERAPY HEMOSTASIS CLIP PLACEMENT  Patient location during evaluation: PACU Anesthesia Type: General Level of consciousness: awake and alert Pain management: pain level controlled Vital Signs Assessment: post-procedure vital signs reviewed and stable Respiratory status: spontaneous breathing, nonlabored ventilation, respiratory function stable and patient connected to nasal cannula oxygen Cardiovascular status: blood pressure returned to baseline and stable Postop Assessment: no apparent nausea or vomiting Anesthetic complications: no   There were no known notable events for this encounter.   Last Vitals:  Vitals:   12/21/23 1247 12/21/23 1347  BP: 137/65 117/61  Pulse: 64 74  Resp: 19 18  Temp: 36.7 C 36.8 C  SpO2: 99% 95%    Last Pain:  Vitals:   12/21/23 1347  TempSrc: Oral  PainSc: 0-No pain                 Ceejay Kegley L Treyana Sturgell

## 2023-12-21 NOTE — Discharge Instructions (Addendum)
You are being discharged to home.  Resume your previous diet.  We are waiting for your pathology results.  Your physician has recommended a repeat colonoscopy in three years for surveillance.  

## 2023-12-24 LAB — SURGICAL PATHOLOGY

## 2023-12-25 ENCOUNTER — Encounter (HOSPITAL_COMMUNITY): Payer: Self-pay | Admitting: Gastroenterology

## 2023-12-27 ENCOUNTER — Encounter (INDEPENDENT_AMBULATORY_CARE_PROVIDER_SITE_OTHER): Payer: Self-pay | Admitting: *Deleted

## 2024-04-22 DIAGNOSIS — R300 Dysuria: Secondary | ICD-10-CM | POA: Diagnosis not present

## 2024-04-22 DIAGNOSIS — Z6827 Body mass index (BMI) 27.0-27.9, adult: Secondary | ICD-10-CM | POA: Diagnosis not present

## 2024-04-22 DIAGNOSIS — G43909 Migraine, unspecified, not intractable, without status migrainosus: Secondary | ICD-10-CM | POA: Diagnosis not present

## 2024-04-23 DIAGNOSIS — G43909 Migraine, unspecified, not intractable, without status migrainosus: Secondary | ICD-10-CM | POA: Diagnosis not present

## 2024-04-23 DIAGNOSIS — Z6826 Body mass index (BMI) 26.0-26.9, adult: Secondary | ICD-10-CM | POA: Diagnosis not present

## 2024-05-15 DIAGNOSIS — G43909 Migraine, unspecified, not intractable, without status migrainosus: Secondary | ICD-10-CM | POA: Diagnosis not present

## 2024-05-15 DIAGNOSIS — Z0000 Encounter for general adult medical examination without abnormal findings: Secondary | ICD-10-CM | POA: Diagnosis not present

## 2024-05-15 DIAGNOSIS — Z0001 Encounter for general adult medical examination with abnormal findings: Secondary | ICD-10-CM | POA: Diagnosis not present

## 2024-05-15 DIAGNOSIS — Z1331 Encounter for screening for depression: Secondary | ICD-10-CM | POA: Diagnosis not present

## 2024-05-15 DIAGNOSIS — Z1389 Encounter for screening for other disorder: Secondary | ICD-10-CM | POA: Diagnosis not present

## 2024-05-15 DIAGNOSIS — Z6827 Body mass index (BMI) 27.0-27.9, adult: Secondary | ICD-10-CM | POA: Diagnosis not present

## 2024-06-11 DIAGNOSIS — R519 Headache, unspecified: Secondary | ICD-10-CM | POA: Diagnosis not present

## 2024-06-11 DIAGNOSIS — I10 Essential (primary) hypertension: Secondary | ICD-10-CM | POA: Diagnosis not present

## 2024-06-11 DIAGNOSIS — E039 Hypothyroidism, unspecified: Secondary | ICD-10-CM | POA: Diagnosis not present

## 2024-06-11 DIAGNOSIS — Z6828 Body mass index (BMI) 28.0-28.9, adult: Secondary | ICD-10-CM | POA: Diagnosis not present

## 2024-07-14 DIAGNOSIS — Z6828 Body mass index (BMI) 28.0-28.9, adult: Secondary | ICD-10-CM | POA: Diagnosis not present

## 2024-07-14 DIAGNOSIS — I10 Essential (primary) hypertension: Secondary | ICD-10-CM | POA: Diagnosis not present

## 2024-07-14 DIAGNOSIS — R001 Bradycardia, unspecified: Secondary | ICD-10-CM | POA: Diagnosis not present

## 2024-07-14 DIAGNOSIS — E039 Hypothyroidism, unspecified: Secondary | ICD-10-CM | POA: Diagnosis not present

## 2024-07-14 DIAGNOSIS — R5383 Other fatigue: Secondary | ICD-10-CM | POA: Diagnosis not present

## 2024-07-29 ENCOUNTER — Encounter: Payer: Self-pay | Admitting: Internal Medicine

## 2024-07-29 ENCOUNTER — Ambulatory Visit: Attending: Internal Medicine | Admitting: Internal Medicine

## 2024-07-29 VITALS — BP 128/78 | HR 62 | Ht 60.0 in | Wt 147.4 lb

## 2024-07-29 DIAGNOSIS — R5383 Other fatigue: Secondary | ICD-10-CM

## 2024-07-29 DIAGNOSIS — I351 Nonrheumatic aortic (valve) insufficiency: Secondary | ICD-10-CM

## 2024-07-29 MED ORDER — LISINOPRIL 40 MG PO TABS: 40.0000 mg | ORAL_TABLET | Freq: Every day | ORAL | 1 refills | Status: AC

## 2024-07-29 NOTE — Progress Notes (Signed)
 Updated medications

## 2024-07-29 NOTE — Patient Instructions (Signed)
 Medication Instructions:  Your physician has recommended you make the following change in your medication:  Please stop Propranolol Please increase Lisinopril  to 40 Mg daily   Labwork: None   Testing/Procedures: Your physician has requested that you have an echocardiogram. Echocardiography is a painless test that uses sound waves to create images of your heart. It provides your doctor with information about the size and shape of your heart and how well your heart's chambers and valves are working. This procedure takes approximately one hour. There are no restrictions for this procedure. Please do NOT wear cologne, perfume, aftershave, or lotions (deodorant is allowed). Please arrive 15 minutes prior to your appointment time.  Please note: We ask at that you not bring children with you during ultrasound (echo/ vascular) testing. Due to room size and safety concerns, children are not allowed in the ultrasound rooms during exams. Our front office staff cannot provide observation of children in our lobby area while testing is being conducted. An adult accompanying a patient to their appointment will only be allowed in the ultrasound room at the discretion of the ultrasound technician under special circumstances. We apologize for any inconvenience.  Follow-Up: Your physician recommends that you schedule a follow-up appointment in: 3 months   Any Other Special Instructions Will Be Listed Below (If Applicable).  If you need a refill on your cardiac medications before your next appointment, please call your pharmacy.

## 2024-07-30 DIAGNOSIS — I351 Nonrheumatic aortic (valve) insufficiency: Secondary | ICD-10-CM | POA: Insufficient documentation

## 2024-07-30 DIAGNOSIS — R5383 Other fatigue: Secondary | ICD-10-CM | POA: Insufficient documentation

## 2024-07-30 NOTE — Progress Notes (Signed)
 Cardiology Office Note  Date: 07/30/2024   ID: Anne Martinez, DOB 10-May-1956, MRN [id number]  PCP:  Job Bolt, PA  Cardiologist:  Diannah SHAUNNA Maywood, MD Electrophysiologist:  None   History of Present Illness: Anne Martinez is a 68 y.o. adult  Referred to cardiology clinic for the management of aortic regurgitation, fatigue and bradycardia.  Echocardiogram reviewed in 2021, normal LVEF and moderate AI.  Heart rates reviewed, more than 60 bpm on the EMR.  Chronic fatigue, likely unrelated to heart rates.  Does not have any angina/DOE.  No dizziness, palpitations, syncope or leg swelling.  Past Medical History:  Diagnosis Date   Anxiety    History of pneumonia     Past Surgical History:  Procedure Laterality Date   BLADDER SUSPENSION     COLONOSCOPY WITH PROPOFOL  N/A 12/21/2023   Procedure: COLONOSCOPY WITH PROPOFOL ;  Surgeon: Eartha Angelia Sieving, MD;  [facility]: [facility];  Service: Gastroenterology;  Laterality: N/A;  2:00pm;asa 1, per office pt won't move up   ENDOMETRIAL ABLATION     HEMOSTASIS CLIP PLACEMENT  12/21/2023   Procedure: HEMOSTASIS CLIP PLACEMENT;  Surgeon: Eartha Angelia Sieving, MD;  [facility]: [facility];  Service: Gastroenterology;;   POLYPECTOMY  12/21/2023   Procedure: POLYPECTOMY INTESTINAL;  Surgeon: Eartha Angelia Sieving, MD;  [facility]: [facility];  Service: Gastroenterology;;   MATIAS  12/21/2023   Procedure: MATIAS;  Surgeon: Eartha Angelia, Sieving, MD;  [facility]: [facility];  Service: Gastroenterology;;    Current Outpatient Medications  Medication Sig Dispense Refill   acetaminophen (TYLENOL) 500 MG tablet Take 1,000 mg by mouth as needed.     ferrous sulfate 325 (65 FE) MG tablet Take 325 mg by mouth daily.     ibuprofen (ADVIL) 200 MG tablet Take 200 mg by mouth every 6 (six) hours as needed for mild pain (pain score 1-3).     levothyroxine (SYNTHROID) 50 MCG tablet Take 50 mcg by mouth daily.      meclizine (ANTIVERT) 25 MG tablet Take 12.5 mg by mouth as needed.     lisinopril  (ZESTRIL ) 40 MG tablet Take 1 tablet (40 mg total) by mouth daily. 90 tablet 1   No current facility-administered medications for this visit.   Allergies:  Ceftin [cefuroxime], Penicillins, and Sulfa antibiotics   Social History: The patient  reports that he has never smoked. He has never used smokeless tobacco. He reports current alcohol  use. He reports current drug use. Drug: Marijuana.   Family History: The patient's family history includes Heart attack in his father; Heart disease in his mother; Hypertension in his mother.   ROS:  Please see the history of present illness. Otherwise, complete review of systems is positive for none  All other systems are reviewed and negative.   Physical Exam: VS:  BP 128/78   Pulse 62   Ht 5' (1.524 m)   Wt 147 lb 6.4 oz (66.9 kg)   SpO2 100%   BMI 28.79 kg/m , BMI Body mass index is 28.79 kg/m.  Wt Readings from Last 3 Encounters:  07/29/24 147 lb 6.4 oz (66.9 kg)  12/21/23 149 lb (67.6 kg)  08/14/23 139 lb 4.8 oz (63.2 kg)    General: Patient appears comfortable at rest. HEENT: Conjunctiva and lids normal, oropharynx clear with moist mucosa. Neck: Supple, no elevated JVP or carotid bruits, no thyromegaly. Lungs: Clear to auscultation, nonlabored breathing at rest. Cardiac: Regular rate and rhythm, no S3 or significant systolic murmur, no pericardial  rub. Abdomen: Soft, nontender, no hepatomegaly, bowel sounds present, no guarding or rebound. Extremities: No pitting edema, distal pulses 2+. Skin: Warm and dry. Musculoskeletal: No kyphosis. Neuropsychiatric: Alert and oriented x3, affect grossly appropriate.  Recent Labwork: No results found for requested labs within last 365 days.  No results found for: CHOL, TRIG, HDL, CHOLHDL, VLDL, LDLCALC, LDLDIRECT   Assessment and Plan:   HTN, controlled - Blood pressures better controlled  after starting propranolol.  Will discontinue propranolol due to moderate AI on echocardiogram. - Increase lisinopril  from 20 mg to 40 mg once daily.  Moderate aortic regurgitation in 2021 - Repeat echocardiogram.  Asymptomatic.  Fatigue - Heart rates reviewed, more than 60 bpm.  Fatigue unlikely related to heart rates.  Nonspecific symptom.  Management per primary team.  Does not have any symptoms of angina or DOE, does not need stress test.  I spent 40 minutes in reviewing prior records, imaging, tests, more than 3 labs, discussion of HTN, AI with the patient and documentation.  Medication Adjustments/Labs and Tests Ordered: Current medicines are reviewed at length with the patient today.  Concerns regarding medicines are outlined above.    Disposition:  Follow up 3 months  Signed Leyanna Bittman Priya Shreyas Piatkowski, MD, 07/30/2024 3:58 PM    [facility] at [facility] [address] [place], [place], [place] [address]

## 2024-08-18 DIAGNOSIS — R072 Precordial pain: Secondary | ICD-10-CM | POA: Diagnosis not present

## 2024-08-18 DIAGNOSIS — Z880 Allergy status to penicillin: Secondary | ICD-10-CM | POA: Diagnosis not present

## 2024-08-18 DIAGNOSIS — R0789 Other chest pain: Secondary | ICD-10-CM | POA: Diagnosis not present

## 2024-08-18 DIAGNOSIS — R0602 Shortness of breath: Secondary | ICD-10-CM | POA: Diagnosis not present

## 2024-08-18 DIAGNOSIS — R918 Other nonspecific abnormal finding of lung field: Secondary | ICD-10-CM | POA: Diagnosis not present

## 2024-08-18 DIAGNOSIS — R030 Elevated blood-pressure reading, without diagnosis of hypertension: Secondary | ICD-10-CM | POA: Diagnosis not present

## 2024-08-18 DIAGNOSIS — J9811 Atelectasis: Secondary | ICD-10-CM | POA: Diagnosis not present

## 2024-08-18 DIAGNOSIS — Z881 Allergy status to other antibiotic agents status: Secondary | ICD-10-CM | POA: Diagnosis not present

## 2024-08-18 DIAGNOSIS — R079 Chest pain, unspecified: Secondary | ICD-10-CM | POA: Diagnosis not present

## 2024-08-18 DIAGNOSIS — R0989 Other specified symptoms and signs involving the circulatory and respiratory systems: Secondary | ICD-10-CM | POA: Diagnosis not present

## 2024-08-18 DIAGNOSIS — R1013 Epigastric pain: Secondary | ICD-10-CM | POA: Diagnosis not present

## 2024-08-18 DIAGNOSIS — K219 Gastro-esophageal reflux disease without esophagitis: Secondary | ICD-10-CM | POA: Diagnosis not present

## 2024-08-18 DIAGNOSIS — Z882 Allergy status to sulfonamides status: Secondary | ICD-10-CM | POA: Diagnosis not present

## 2024-08-18 DIAGNOSIS — Z90710 Acquired absence of both cervix and uterus: Secondary | ICD-10-CM | POA: Diagnosis not present

## 2024-08-18 DIAGNOSIS — R9431 Abnormal electrocardiogram [ECG] [EKG]: Secondary | ICD-10-CM | POA: Diagnosis not present

## 2024-08-20 ENCOUNTER — Ambulatory Visit: Attending: Internal Medicine

## 2024-08-20 DIAGNOSIS — I351 Nonrheumatic aortic (valve) insufficiency: Secondary | ICD-10-CM

## 2024-08-20 LAB — ECHOCARDIOGRAM COMPLETE
AR max vel: 1.81 cm2
AV Area VTI: 1.99 cm2
AV Area mean vel: 1.99 cm2
AV Mean grad: 10 mmHg
AV Peak grad: 21.7 mmHg
AV Vena cont: 0.5 cm
Ao pk vel: 2.33 m/s
Area-P 1/2: 2.76 cm2
Calc EF: 67.1 %
P 1/2 time: 477 ms
S' Lateral: 2.8 cm
Single Plane A2C EF: 75.4 %
Single Plane A4C EF: 59.2 %

## 2024-09-04 ENCOUNTER — Ambulatory Visit: Payer: Self-pay | Admitting: Internal Medicine

## 2024-09-17 ENCOUNTER — Encounter (INDEPENDENT_AMBULATORY_CARE_PROVIDER_SITE_OTHER): Payer: Self-pay | Admitting: Gastroenterology

## 2024-09-26 DIAGNOSIS — L989 Disorder of the skin and subcutaneous tissue, unspecified: Secondary | ICD-10-CM | POA: Diagnosis not present

## 2024-09-26 DIAGNOSIS — Z6827 Body mass index (BMI) 27.0-27.9, adult: Secondary | ICD-10-CM | POA: Diagnosis not present

## 2024-09-26 DIAGNOSIS — R21 Rash and other nonspecific skin eruption: Secondary | ICD-10-CM | POA: Diagnosis not present

## 2024-10-16 DIAGNOSIS — L989 Disorder of the skin and subcutaneous tissue, unspecified: Secondary | ICD-10-CM | POA: Diagnosis not present

## 2024-10-16 DIAGNOSIS — R21 Rash and other nonspecific skin eruption: Secondary | ICD-10-CM | POA: Diagnosis not present

## 2024-10-16 DIAGNOSIS — Z6827 Body mass index (BMI) 27.0-27.9, adult: Secondary | ICD-10-CM | POA: Diagnosis not present

## 2024-10-27 ENCOUNTER — Telehealth: Payer: Self-pay | Admitting: Internal Medicine

## 2024-10-27 NOTE — Telephone Encounter (Signed)
 Assessment and Plan:    07/29/24 Office Note HTN, controlled - Blood pressures better controlled after starting propranolol.  Will discontinue propranolol due to moderate AI on echocardiogram. - Increase lisinopril  from 20 mg to 40 mg once daily.     Ashley at [facility]  notified dose is lisinopril  40 mg every day .    07/29/24 AVS to patient Medication Instructions:  Your physician has recommended you make the following change in your medication:  Please stop Propranolol Please increase Lisinopril  to 40 Mg daily

## 2024-10-27 NOTE — Telephone Encounter (Signed)
 Pt c/o medication issue:  1. Name of Medication:   lisinopril  (ZESTRIL ) 40 MG tablet   2. How are you currently taking this medication (dosage and times per day)?   3. Are you having a reaction (difficulty breathing--STAT)?   4. What is your medication issue?   Caller Edwardo) wants a call back to confirm patient dosage and medication instructions.

## 2024-11-13 ENCOUNTER — Ambulatory Visit: Attending: Internal Medicine | Admitting: Internal Medicine

## 2024-11-13 VITALS — BP 136/73 | HR 73 | Ht 60.0 in | Wt 148.2 lb

## 2024-11-13 DIAGNOSIS — I351 Nonrheumatic aortic (valve) insufficiency: Secondary | ICD-10-CM | POA: Diagnosis not present

## 2024-11-13 MED ORDER — AMLODIPINE BESYLATE 5 MG PO TABS: 5.0000 mg | ORAL_TABLET | Freq: Every day | ORAL | 3 refills | Status: AC

## 2024-11-13 NOTE — Progress Notes (Signed)
 Cardiology Office Note  Date: 11/13/2024   ID: Vernette Moise, DOB [birth year], MRN [id number]  PCP:  Job Bolt, PA  Cardiologist:  Diannah SHAUNNA Maywood, MD Electrophysiologist:  None   History of Present Illness: Anne Martinez is a 68 y.o. adult  Here for follow-up of HTN and aortic regurgitation.  Echocardiogram reviewed in 2021, normal LVEF and moderate AI.  Repeat echocardiogram in September 2025 showed normal LV/RV, mild to moderate AI and CVP 3 mmHg.  Patient reported that her blood pressures are poorly controlled at home.  However as soon as she smoked pot (which she has been doing it for the last few decades), her blood pressure normalized.  Currently on is not pill 40 mg once daily.  I increased the dose from 20 mg to 40 mg in the last clinic visit.  She does not have any symptoms of angina or DOE.  No dizziness, palpitations, syncope, leg swelling.  Past Medical History:  Diagnosis Date   Anxiety    History of pneumonia     Past Surgical History:  Procedure Laterality Date   BLADDER SUSPENSION     COLONOSCOPY WITH PROPOFOL  N/A 12/21/2023   Procedure: COLONOSCOPY WITH PROPOFOL ;  Surgeon: Eartha Angelia Sieving, MD;  [facility];  Service: Gastroenterology;  Laterality: N/A;  2:00pm;asa 1, per office pt won't move up   ENDOMETRIAL ABLATION     HEMOSTASIS CLIP PLACEMENT  12/21/2023   Procedure: HEMOSTASIS CLIP PLACEMENT;  Surgeon: Eartha Angelia Sieving, MD;  [facility];  Service: Gastroenterology;;   POLYPECTOMY  12/21/2023   Procedure: POLYPECTOMY INTESTINAL;  Surgeon: Eartha Angelia Sieving, MD;  [facility];  Service: Gastroenterology;;   MATIAS  12/21/2023   Procedure: MATIAS;  Surgeon: Eartha Angelia, Sieving, MD;  [facility];  Service: Gastroenterology;;    Current Outpatient Medications  Medication Sig Dispense Refill   acetaminophen (TYLENOL) 500 MG tablet Take 1,000 mg by mouth as  needed.     ibuprofen (ADVIL) 200 MG tablet Take 200 mg by mouth every 6 (six) hours as needed for mild pain (pain score 1-3).     levothyroxine (SYNTHROID) 50 MCG tablet Take 50 mcg by mouth daily.     lisinopril  (ZESTRIL ) 40 MG tablet Take 1 tablet (40 mg total) by mouth daily. 90 tablet 1   meclizine (ANTIVERT) 25 MG tablet Take 12.5 mg by mouth as needed.     mupirocin ointment (BACTROBAN) 2 % Apply topically daily.     ferrous sulfate 325 (65 FE) MG tablet Take 325 mg by mouth daily. (Patient not taking: Reported on 11/13/2024)     No current facility-administered medications for this visit.   Allergies:  Ceftin [cefuroxime], Penicillins, and Sulfa antibiotics   Social History: The patient  reports that he has never smoked. He has never used smokeless tobacco. He reports current alcohol  use. He reports current drug use. Drug: Marijuana.   Family History: The patient's family history includes Heart attack in his father; Heart disease in his mother; Hypertension in his mother.   ROS:  Please see the history of present illness. Otherwise, complete review of systems is positive for none  All other systems are reviewed and negative.   Physical Exam: VS:  BP 136/73 (BP Location: Right Arm)   Pulse 73   Ht 5' (1.524 m)   Wt 148 lb 3.2 oz (67.2 kg)   SpO2 93%   BMI 28.94 kg/m , BMI Body mass index is 28.94  kg/m.  Wt Readings from Last 3 Encounters:  11/13/24 148 lb 3.2 oz (67.2 kg)  07/29/24 147 lb 6.4 oz (66.9 kg)  12/21/23 149 lb (67.6 kg)    General: Patient appears comfortable at rest. HEENT: Conjunctiva and lids normal, oropharynx clear with moist mucosa. Neck: Supple, no elevated JVP or carotid bruits, no thyromegaly. Lungs: Clear to auscultation, nonlabored breathing at rest. Cardiac: Regular rate and rhythm, no S3 or significant systolic murmur, no pericardial rub. Abdomen: Soft, nontender, no hepatomegaly, bowel sounds present, no guarding or rebound. Extremities: No  pitting edema, distal pulses 2+. Skin: Warm and dry. Musculoskeletal: No kyphosis. Neuropsychiatric: Alert and oriented x3, affect grossly appropriate.  Recent Labwork: No results found for requested labs within last 365 days.  No results found for: CHOL, TRIG, HDL, CHOLHDL, VLDL, LDLCALC, LDLDIRECT   Assessment and Plan:   HTN, poorly controlled - Better blood pressure control on propranolol previously however had to be discontinued due to moderate AI. - Continue lisinopril  40 mg once daily. - Start amlodipine 5 mg once daily.  Moderate aortic regurgitation - Repeat echocardiogram in 2025 showed mild to moderate AI.  Stable from 2021.  Update echocardiogram in 1 year before the next clinic visit.  I spent 30 minutes in reviewing prior records, imaging, tests, more than 3 labs, discussion of HTN, AI with the patient and documentation.  Medication Adjustments/Labs and Tests Ordered: Current medicines are reviewed at length with the patient today.  Concerns regarding medicines are outlined above.    Disposition:  Follow up 1 year  Signed Lelani Garnett Priya Janesia Joswick, MD, 11/13/2024 12:09 PM    [facility] at [facility] [address] [place], [place], [place] [address]

## 2024-11-13 NOTE — Patient Instructions (Signed)
 Medication Instructions:  Your physician has recommended you make the following change in your medication:  Start taking Amlodipine 5 mg once daily  Continue taking all other medications as prescribed   Labwork: None  Testing/Procedures: Your physician has requested that you have an echocardiogram in one year. Echocardiography is a painless test that uses sound waves to create images of your heart. It provides your doctor with information about the size and shape of your heart and how well your hearts chambers and valves are working. This procedure takes approximately one hour. There are no restrictions for this procedure. Please do NOT wear cologne, perfume, aftershave, or lotions (deodorant is allowed). Please arrive 15 minutes prior to your appointment time.  Please note: We ask at that you not bring children with you during ultrasound (echo/ vascular) testing. Due to room size and safety concerns, children are not allowed in the ultrasound rooms during exams. Our front office staff cannot provide observation of children in our lobby area while testing is being conducted. An adult accompanying a patient to their appointment will only be allowed in the ultrasound room at the discretion of the ultrasound technician under special circumstances. We apologize for any inconvenience.   Follow-Up: Your physician recommends that you schedule a follow-up appointment in: 1 year. You will receive a reminder call in about 8 months reminding you to schedule your appointment. If you don't receive this call, please contact our office.   Any Other Special Instructions Will Be Listed Below (If Applicable). Thank you for choosing [facility]!     If you need a refill on your cardiac medications before your next appointment, please call your pharmacy.

## 2025-11-12 ENCOUNTER — Ambulatory Visit
